# Patient Record
Sex: Female | Born: 1960 | Race: Black or African American | Hispanic: No | Smoking: Current every day smoker
Health system: Southern US, Community
[De-identification: ages and names within clinical notes are randomized; demographics above are authoritative.]

## PROBLEM LIST (undated history)

## (undated) DIAGNOSIS — I2510 Atherosclerotic heart disease of native coronary artery without angina pectoris: Secondary | ICD-10-CM

## (undated) DIAGNOSIS — I429 Cardiomyopathy, unspecified: Secondary | ICD-10-CM

## (undated) DIAGNOSIS — K5010 Crohn's disease of large intestine without complications: Secondary | ICD-10-CM

## (undated) DIAGNOSIS — I10 Essential (primary) hypertension: Secondary | ICD-10-CM

---

## 2014-05-16 DIAGNOSIS — K5010 Crohn's disease of large intestine without complications: Secondary | ICD-10-CM | POA: Diagnosis not present

## 2014-05-16 DIAGNOSIS — Z79899 Other long term (current) drug therapy: Secondary | ICD-10-CM | POA: Diagnosis not present

## 2014-06-28 DIAGNOSIS — Z79899 Other long term (current) drug therapy: Secondary | ICD-10-CM | POA: Diagnosis not present

## 2014-06-28 DIAGNOSIS — K5010 Crohn's disease of large intestine without complications: Secondary | ICD-10-CM | POA: Diagnosis not present

## 2014-08-26 DIAGNOSIS — Z79899 Other long term (current) drug therapy: Secondary | ICD-10-CM | POA: Diagnosis not present

## 2014-08-26 DIAGNOSIS — K5010 Crohn's disease of large intestine without complications: Secondary | ICD-10-CM | POA: Diagnosis not present

## 2014-10-07 DIAGNOSIS — K5010 Crohn's disease of large intestine without complications: Secondary | ICD-10-CM | POA: Diagnosis not present

## 2014-10-07 DIAGNOSIS — Z79899 Other long term (current) drug therapy: Secondary | ICD-10-CM | POA: Diagnosis not present

## 2014-10-24 DIAGNOSIS — H40003 Preglaucoma, unspecified, bilateral: Secondary | ICD-10-CM | POA: Diagnosis not present

## 2014-11-18 DIAGNOSIS — K5010 Crohn's disease of large intestine without complications: Secondary | ICD-10-CM | POA: Diagnosis not present

## 2014-11-18 DIAGNOSIS — Z79899 Other long term (current) drug therapy: Secondary | ICD-10-CM | POA: Diagnosis not present

## 2014-12-30 DIAGNOSIS — K5010 Crohn's disease of large intestine without complications: Secondary | ICD-10-CM | POA: Diagnosis not present

## 2014-12-30 DIAGNOSIS — Z79899 Other long term (current) drug therapy: Secondary | ICD-10-CM | POA: Diagnosis not present

## 2014-12-30 DIAGNOSIS — Z5112 Encounter for antineoplastic immunotherapy: Secondary | ICD-10-CM | POA: Diagnosis not present

## 2015-01-18 DIAGNOSIS — I10 Essential (primary) hypertension: Secondary | ICD-10-CM | POA: Diagnosis not present

## 2015-01-18 DIAGNOSIS — I5022 Chronic systolic (congestive) heart failure: Secondary | ICD-10-CM | POA: Diagnosis not present

## 2015-01-18 DIAGNOSIS — E785 Hyperlipidemia, unspecified: Secondary | ICD-10-CM | POA: Diagnosis not present

## 2015-02-10 DIAGNOSIS — Z79899 Other long term (current) drug therapy: Secondary | ICD-10-CM | POA: Diagnosis not present

## 2015-02-10 DIAGNOSIS — K5010 Crohn's disease of large intestine without complications: Secondary | ICD-10-CM | POA: Diagnosis not present

## 2015-03-08 DIAGNOSIS — L400 Psoriasis vulgaris: Secondary | ICD-10-CM | POA: Diagnosis not present

## 2015-03-24 DIAGNOSIS — K5010 Crohn's disease of large intestine without complications: Secondary | ICD-10-CM | POA: Diagnosis not present

## 2015-03-24 DIAGNOSIS — Z79899 Other long term (current) drug therapy: Secondary | ICD-10-CM | POA: Diagnosis not present

## 2015-05-05 DIAGNOSIS — Z79899 Other long term (current) drug therapy: Secondary | ICD-10-CM | POA: Diagnosis not present

## 2015-05-05 DIAGNOSIS — K5010 Crohn's disease of large intestine without complications: Secondary | ICD-10-CM | POA: Diagnosis not present

## 2015-05-10 DIAGNOSIS — L400 Psoriasis vulgaris: Secondary | ICD-10-CM | POA: Diagnosis not present

## 2015-06-16 DIAGNOSIS — Z79899 Other long term (current) drug therapy: Secondary | ICD-10-CM | POA: Diagnosis not present

## 2015-06-16 DIAGNOSIS — K5010 Crohn's disease of large intestine without complications: Secondary | ICD-10-CM | POA: Diagnosis not present

## 2015-07-28 DIAGNOSIS — Z79899 Other long term (current) drug therapy: Secondary | ICD-10-CM | POA: Diagnosis not present

## 2015-07-28 DIAGNOSIS — K5010 Crohn's disease of large intestine without complications: Secondary | ICD-10-CM | POA: Diagnosis not present

## 2015-08-03 DIAGNOSIS — Z79899 Other long term (current) drug therapy: Secondary | ICD-10-CM | POA: Diagnosis not present

## 2015-08-03 DIAGNOSIS — K5010 Crohn's disease of large intestine without complications: Secondary | ICD-10-CM | POA: Diagnosis not present

## 2015-08-09 DIAGNOSIS — L299 Pruritus, unspecified: Secondary | ICD-10-CM | POA: Diagnosis not present

## 2015-08-09 DIAGNOSIS — L400 Psoriasis vulgaris: Secondary | ICD-10-CM | POA: Diagnosis not present

## 2015-09-22 DIAGNOSIS — Z79899 Other long term (current) drug therapy: Secondary | ICD-10-CM | POA: Diagnosis not present

## 2015-09-22 DIAGNOSIS — K5010 Crohn's disease of large intestine without complications: Secondary | ICD-10-CM | POA: Diagnosis not present

## 2015-11-16 DIAGNOSIS — D127 Benign neoplasm of rectosigmoid junction: Secondary | ICD-10-CM | POA: Diagnosis not present

## 2015-11-16 DIAGNOSIS — Z79899 Other long term (current) drug therapy: Secondary | ICD-10-CM | POA: Diagnosis not present

## 2015-11-16 DIAGNOSIS — Z1211 Encounter for screening for malignant neoplasm of colon: Secondary | ICD-10-CM | POA: Diagnosis not present

## 2015-11-16 DIAGNOSIS — K5190 Ulcerative colitis, unspecified, without complications: Secondary | ICD-10-CM | POA: Diagnosis not present

## 2015-11-16 DIAGNOSIS — Z880 Allergy status to penicillin: Secondary | ICD-10-CM | POA: Diagnosis not present

## 2015-11-16 DIAGNOSIS — D120 Benign neoplasm of cecum: Secondary | ICD-10-CM | POA: Diagnosis not present

## 2015-11-16 DIAGNOSIS — K5010 Crohn's disease of large intestine without complications: Secondary | ICD-10-CM | POA: Diagnosis not present

## 2016-01-11 DIAGNOSIS — K5010 Crohn's disease of large intestine without complications: Secondary | ICD-10-CM | POA: Diagnosis not present

## 2016-01-11 DIAGNOSIS — Z79899 Other long term (current) drug therapy: Secondary | ICD-10-CM | POA: Diagnosis not present

## 2016-01-24 DIAGNOSIS — R5383 Other fatigue: Secondary | ICD-10-CM | POA: Diagnosis not present

## 2016-01-24 DIAGNOSIS — F17200 Nicotine dependence, unspecified, uncomplicated: Secondary | ICD-10-CM | POA: Diagnosis not present

## 2016-01-24 DIAGNOSIS — I351 Nonrheumatic aortic (valve) insufficiency: Secondary | ICD-10-CM | POA: Diagnosis not present

## 2016-01-24 DIAGNOSIS — I420 Dilated cardiomyopathy: Secondary | ICD-10-CM | POA: Diagnosis not present

## 2016-01-24 DIAGNOSIS — R931 Abnormal findings on diagnostic imaging of heart and coronary circulation: Secondary | ICD-10-CM | POA: Diagnosis not present

## 2016-01-24 DIAGNOSIS — I2510 Atherosclerotic heart disease of native coronary artery without angina pectoris: Secondary | ICD-10-CM | POA: Diagnosis not present

## 2016-01-24 DIAGNOSIS — I340 Nonrheumatic mitral (valve) insufficiency: Secondary | ICD-10-CM | POA: Diagnosis not present

## 2016-01-24 DIAGNOSIS — I10 Essential (primary) hypertension: Secondary | ICD-10-CM | POA: Diagnosis not present

## 2016-01-24 DIAGNOSIS — K5010 Crohn's disease of large intestine without complications: Secondary | ICD-10-CM | POA: Diagnosis not present

## 2016-01-24 DIAGNOSIS — Z79899 Other long term (current) drug therapy: Secondary | ICD-10-CM | POA: Diagnosis not present

## 2016-03-07 DIAGNOSIS — Z79899 Other long term (current) drug therapy: Secondary | ICD-10-CM | POA: Diagnosis not present

## 2016-03-07 DIAGNOSIS — K5010 Crohn's disease of large intestine without complications: Secondary | ICD-10-CM | POA: Diagnosis not present

## 2016-05-02 DIAGNOSIS — Z79899 Other long term (current) drug therapy: Secondary | ICD-10-CM | POA: Diagnosis not present

## 2016-05-02 DIAGNOSIS — K5010 Crohn's disease of large intestine without complications: Secondary | ICD-10-CM | POA: Diagnosis not present

## 2016-06-28 DIAGNOSIS — Z79899 Other long term (current) drug therapy: Secondary | ICD-10-CM | POA: Diagnosis not present

## 2016-06-28 DIAGNOSIS — K5010 Crohn's disease of large intestine without complications: Secondary | ICD-10-CM | POA: Diagnosis not present

## 2016-08-28 DIAGNOSIS — Z79899 Other long term (current) drug therapy: Secondary | ICD-10-CM | POA: Diagnosis not present

## 2016-08-28 DIAGNOSIS — K50113 Crohn's disease of large intestine with fistula: Secondary | ICD-10-CM | POA: Diagnosis not present

## 2016-08-29 ENCOUNTER — Ambulatory Visit (HOSPITAL_COMMUNITY)
Admission: EM | Admit: 2016-08-29 | Discharge: 2016-08-29 | Disposition: A | Discharge status: Home / Self Care | Payer: Medicare Other | Attending: Family Medicine | Admitting: Family Medicine

## 2016-08-29 ENCOUNTER — Ambulatory Visit (INDEPENDENT_AMBULATORY_CARE_PROVIDER_SITE_OTHER): Payer: Medicare Other

## 2016-08-29 ENCOUNTER — Encounter (HOSPITAL_COMMUNITY): Payer: Self-pay | Admitting: Family Medicine

## 2016-08-29 DIAGNOSIS — R0602 Shortness of breath: Secondary | ICD-10-CM

## 2016-08-29 DIAGNOSIS — T7840XA Allergy, unspecified, initial encounter: Secondary | ICD-10-CM | POA: Diagnosis not present

## 2016-08-29 DIAGNOSIS — L309 Dermatitis, unspecified: Secondary | ICD-10-CM

## 2016-08-29 DIAGNOSIS — R05 Cough: Secondary | ICD-10-CM | POA: Diagnosis not present

## 2016-08-29 HISTORY — DX: Essential (primary) hypertension: I10

## 2016-08-29 HISTORY — DX: Atherosclerotic heart disease of native coronary artery without angina pectoris: I25.10

## 2016-08-29 HISTORY — DX: Cardiomyopathy, unspecified: I42.9

## 2016-08-29 HISTORY — DX: Crohn's disease of large intestine without complications: K50.10

## 2016-08-29 MED ORDER — PREDNISONE 20 MG PO TABS: ORAL_TABLET | ORAL | 0 refills | Status: DC

## 2016-08-29 NOTE — ED Provider Notes (Signed)
[place]    CSN: [id number] Arrival date & time: 08/29/16  1142     History   Chief Complaint Chief Complaint  Patient presents with  . Allergic Reaction    HPI Erika Allen is a 56 y.o. female.   Pt here for allergic reaction. sts that she had her remicade infusion yesterday and woke up this am with swollen lips and rash.   This is a 56 year old woman who just moved here from [facility]. She is being treated at [facility] for chronic fistula arising Crohn's disease. She receives Remicade on a regular basis and usually has some degree of rash after the infusion. She had an infusion yesterday and had not only the rash on her face but also lip swelling. When she called her doctor's at [facility] today they told her to come to urgent care for evaluation.  Patient has also had shortness of breath over the last 24 hours. She has a history of congestive heart failure and is not sure whether this new shortness of breath is a reaction to the Remicade or whether it is a return of the congestive heart failure.      Past Medical History:  Diagnosis Date  . CAD (coronary artery disease)   . Cardiomyopathy ([place])   . Crohn's colitis ([place])   . Hypertension     There are no active problems to display for this patient.   History reviewed. No pertinent surgical history.  OB History    No data available       Home Medications    Prior to Admission medications   Medication Sig Start Date End Date Taking? Authorizing Provider  predniSONE (DELTASONE) 20 MG tablet Two daily with food 08/29/16   Robyn Haber, MD    Family History History reviewed. No pertinent family history.  Social History Social History  Substance Use Topics  . Smoking status: Current Every Day Smoker  . Smokeless tobacco: Never Used  . Alcohol use Not on file     Allergies   Amoxicillin; Erythromycin; Flagyl [metronidazole]; and Latex   Review of Systems Review of Systems    Respiratory: Positive for cough and shortness of breath.   Skin: Positive for rash.     Physical Exam Triage Vital Signs ED Triage Vitals [08/29/16 1230]  Enc Vitals Group     BP 140/86     Pulse Rate 61     Resp 18     Temp 98.5 F (36.9 C)     Temp Source Oral     SpO2 99 %     Weight      Height      Head Circumference      Peak Flow      Pain Score      Pain Loc      Pain Edu?      Excl. in [place]?    No data found.   Updated Vital Signs BP 140/86   Pulse 61   Temp 98.5 F (36.9 C) (Oral)   Resp 18   SpO2 99%    Physical Exam  Constitutional: She is oriented to person, place, and time. She appears well-developed and well-nourished.  HENT:  Right Ear: External ear normal.  Left Ear: External ear normal.  Patient has a papular facial rash with erythema. Her lips are moderately swollen. Tongue is normal and posterior pharynx is clear  Eyes: Conjunctivae and EOM are normal. Pupils are equal, round, and reactive to light.  Neck: Normal range of motion. Neck supple.  Cardiovascular: Normal rate, regular rhythm and normal heart sounds.   Pulmonary/Chest: Effort normal.  Patient has a few fine rales at each of her bases  Musculoskeletal: Normal range of motion. She exhibits no edema, tenderness or deformity.  Neurological: She is alert and oriented to person, place, and time.  Skin: Skin is warm and dry.  Nursing note and vitals reviewed.    UC Treatments / Results  Labs (all labs ordered are listed, but only abnormal results are displayed) Labs Reviewed - No data to display  EKG  EKG Interpretation None       Radiology Dg Chest 2 View  Result Date: 08/29/2016 CLINICAL DATA:  Congestive heart failure.  Shortness of breath EXAM: CHEST  2 VIEW COMPARISON:  None. FINDINGS: Cardiomegaly without edema or effusion. Mild aortic tortuosity. Negative hila. No air bronchogram or pneumothorax. No osseous findings. IMPRESSION: Cardiomegaly without edema.  Electronically Signed   By: Monte Fantasia M.D.   On: 08/29/2016 13:14    Procedures Procedures (including critical care time)  Medications Ordered in UC Medications - No data to display   Initial Impression / Assessment and Plan / UC Course  I have reviewed the triage vital signs and the nursing notes.  Pertinent labs & imaging results that were available during my care of the patient were reviewed by me and considered in my medical decision making (see chart for details).     Final Clinical Impressions(s) / UC Diagnoses   Final diagnoses:  Allergic reaction, initial encounter    New Prescriptions New Prescriptions   PREDNISONE (DELTASONE) 20 MG TABLET    Two daily with food     Robyn Haber, MD 08/29/16 1322

## 2016-08-29 NOTE — ED Triage Notes (Signed)
Pt here for allergic reaction. sts that she had her remicade infusion yesterday and woke up this am with swollen lips and rash.

## 2016-08-29 NOTE — Discharge Instructions (Signed)
The x-ray does not show heart failure. I believe your shortness of breath is related to the Remicade shot.  I'm giving you steroids to combat the allergic reaction as manifested by the rash, swollen lips, and shortness of breath.   While on prednisone, make sure you take your Lasix regularly since prednisone can cause fluid retention

## 2016-10-16 ENCOUNTER — Ambulatory Visit (HOSPITAL_COMMUNITY)
Admission: EM | Admit: 2016-10-16 | Discharge: 2016-10-16 | Disposition: A | Discharge status: Home / Self Care | Payer: Medicare Other | Attending: Internal Medicine | Admitting: Internal Medicine

## 2016-10-16 ENCOUNTER — Encounter (HOSPITAL_COMMUNITY): Payer: Self-pay | Admitting: Emergency Medicine

## 2016-10-16 DIAGNOSIS — R21 Rash and other nonspecific skin eruption: Secondary | ICD-10-CM | POA: Diagnosis not present

## 2016-10-16 DIAGNOSIS — T7840XA Allergy, unspecified, initial encounter: Secondary | ICD-10-CM

## 2016-10-16 MED ORDER — PREDNISONE 10 MG PO TABS: ORAL_TABLET | ORAL | 0 refills | Status: DC

## 2016-10-16 MED ORDER — TRIAMCINOLONE ACETONIDE 0.1 % EX CREA: 1.0000 "application " | TOPICAL_CREAM | Freq: Two times a day (BID) | CUTANEOUS | 0 refills | Status: DC

## 2016-10-16 NOTE — ED Triage Notes (Signed)
April 29 was seen for a rash.  Was treated with prednisone, rash left.  As soon as prednisone was finished, a week later rash returned.  Rash itches, burning

## 2016-10-16 NOTE — ED Provider Notes (Signed)
CSN: [id number]     Arrival date & time 10/16/16  1106 History   First MD Initiated Contact with Patient 10/16/16 1237     Chief Complaint  Patient presents with  . Rash   (Consider location/radiation/quality/duration/timing/severity/associated sxs/prior Treatment) Patient c/o allergic rxn and rash.  Patient states was on prednisone and this helped and rash has returned.  She has an appointment in a few weeks with Dermatology.   The history is provided by the patient.  Rash  Location:  Face and head/neck Head/neck rash location:  L neck, R neck, L ear, R ear and head Facial rash location:  Face Quality: blistering, itchiness and redness   Chronicity:  New Relieved by:  Nothing Worsened by:  Nothing Ineffective treatments:  None tried   Past Medical History:  Diagnosis Date  . CAD (coronary artery disease)   . Cardiomyopathy ([place])   . Crohn's colitis ([place])   . Hypertension    No past surgical history on file. No family history on file. Social History  Substance Use Topics  . Smoking status: Current Every Day Smoker  . Smokeless tobacco: Never Used  . Alcohol use No   OB History    No data available     Review of Systems  Constitutional: Negative.   HENT: Negative.   Eyes: Negative.   Respiratory: Negative.   Cardiovascular: Negative.   Gastrointestinal: Negative.   Endocrine: Negative.   Genitourinary: Negative.   Skin: Positive for rash.  Allergic/Immunologic: Negative.   Neurological: Negative.   Hematological: Negative.   Psychiatric/Behavioral: Negative.     Allergies  Amoxicillin; Erythromycin; Flagyl [metronidazole]; and Latex  Home Medications   Prior to Admission medications   Medication Sig Start Date End Date Taking? Authorizing Provider  InFLIXimab (REMICADE IV) Inject into the vein.   Yes [provider]  OVER THE COUNTER MEDICATION Blood pressure medicine   Yes [provider]  Valsartan (DIOVAN PO) Take by mouth.   Yes  [provider]  predniSONE (DELTASONE) 10 MG tablet Take 4 po qd x 3d then 3po qd x 3d then 2po qd x3d then 2po qd x3d then 1po qd x 3d then stop 10/16/16   Lysbeth Penner, FNP  predniSONE (DELTASONE) 20 MG tablet Two daily with food 08/29/16   Robyn Haber, MD  triamcinolone cream (KENALOG) 0.1 % Apply 1 application topically 2 (two) times daily. 10/16/16   Lysbeth Penner, FNP   Meds Ordered and Administered this Visit  Medications - No data to display  BP (!) 168/109 (BP Location: Right Arm)   Pulse 64   Temp 97.8 F (36.6 C) (Oral)   Resp 18   SpO2 98%  No data found.   Physical Exam  Constitutional: She is oriented to person, place, and time. She appears well-developed and well-nourished.  HENT:  Head: Normocephalic and atraumatic.  Eyes: Conjunctivae and EOM are normal. Pupils are equal, round, and reactive to light.  Neck: Normal range of motion. Neck supple.  Cardiovascular: Normal rate, regular rhythm and normal heart sounds.   Pulmonary/Chest: Effort normal and breath sounds normal.  Neurological: She is alert and oriented to person, place, and time.  Skin: Rash noted.  Erythematous raised itchy rash on face neck and arms  Nursing note and vitals reviewed.   Urgent Care Course     Procedures (including critical care time)  Labs Review Labs Reviewed - No data to display  Imaging Review No results found.   Visual Acuity  Review  Right Eye Distance:   Left Eye Distance:   Bilateral Distance:    Right Eye Near:   Left Eye Near:    Bilateral Near:         MDM   1. Rash   2. Allergic reaction, initial encounter   3. Rash and nonspecific skin eruption    Prenisone 10mg  4 x 3 then 3 x 3 then 2x3 then 1x3 then stop #30 Triamcinolone Cream  Follow up with Dermatology    Lysbeth Penner, FNP 10/16/16 1345

## 2016-10-26 DIAGNOSIS — L270 Generalized skin eruption due to drugs and medicaments taken internally: Secondary | ICD-10-CM | POA: Diagnosis not present

## 2016-10-26 DIAGNOSIS — K50118 Crohn's disease of large intestine with other complication: Secondary | ICD-10-CM | POA: Diagnosis not present

## 2016-10-26 DIAGNOSIS — Z79899 Other long term (current) drug therapy: Secondary | ICD-10-CM | POA: Diagnosis not present

## 2016-11-01 DIAGNOSIS — K610 Anal abscess: Secondary | ICD-10-CM | POA: Diagnosis not present

## 2016-11-01 DIAGNOSIS — K50113 Crohn's disease of large intestine with fistula: Secondary | ICD-10-CM | POA: Diagnosis not present

## 2016-11-01 DIAGNOSIS — L270 Generalized skin eruption due to drugs and medicaments taken internally: Secondary | ICD-10-CM | POA: Diagnosis not present

## 2016-11-01 DIAGNOSIS — I427 Cardiomyopathy due to drug and external agent: Secondary | ICD-10-CM | POA: Diagnosis not present

## 2016-11-01 DIAGNOSIS — F17210 Nicotine dependence, cigarettes, uncomplicated: Secondary | ICD-10-CM | POA: Diagnosis not present

## 2016-11-01 DIAGNOSIS — R238 Other skin changes: Secondary | ICD-10-CM | POA: Diagnosis not present

## 2016-11-20 DIAGNOSIS — L308 Other specified dermatitis: Secondary | ICD-10-CM | POA: Diagnosis not present

## 2016-11-20 DIAGNOSIS — L400 Psoriasis vulgaris: Secondary | ICD-10-CM | POA: Diagnosis not present

## 2017-01-09 DIAGNOSIS — K50119 Crohn's disease of large intestine with unspecified complications: Secondary | ICD-10-CM | POA: Diagnosis not present

## 2017-01-09 DIAGNOSIS — Z79899 Other long term (current) drug therapy: Secondary | ICD-10-CM | POA: Diagnosis not present

## 2017-01-23 DIAGNOSIS — K50119 Crohn's disease of large intestine with unspecified complications: Secondary | ICD-10-CM | POA: Diagnosis not present

## 2017-01-23 DIAGNOSIS — Z79899 Other long term (current) drug therapy: Secondary | ICD-10-CM | POA: Diagnosis not present

## 2017-01-30 DIAGNOSIS — I10 Essential (primary) hypertension: Secondary | ICD-10-CM | POA: Diagnosis not present

## 2017-01-30 DIAGNOSIS — Z79899 Other long term (current) drug therapy: Secondary | ICD-10-CM | POA: Diagnosis not present

## 2017-01-30 DIAGNOSIS — K50113 Crohn's disease of large intestine with fistula: Secondary | ICD-10-CM | POA: Diagnosis not present

## 2017-02-20 DIAGNOSIS — Z79899 Other long term (current) drug therapy: Secondary | ICD-10-CM | POA: Diagnosis not present

## 2017-02-20 DIAGNOSIS — K50119 Crohn's disease of large intestine with unspecified complications: Secondary | ICD-10-CM | POA: Diagnosis not present

## 2017-03-11 DIAGNOSIS — R300 Dysuria: Secondary | ICD-10-CM | POA: Diagnosis not present

## 2017-03-11 DIAGNOSIS — Z79899 Other long term (current) drug therapy: Secondary | ICD-10-CM | POA: Diagnosis not present

## 2017-03-11 DIAGNOSIS — Z720 Tobacco use: Secondary | ICD-10-CM | POA: Diagnosis not present

## 2017-03-11 DIAGNOSIS — Z113 Encounter for screening for infections with a predominantly sexual mode of transmission: Secondary | ICD-10-CM | POA: Diagnosis not present

## 2017-03-11 DIAGNOSIS — F17210 Nicotine dependence, cigarettes, uncomplicated: Secondary | ICD-10-CM | POA: Diagnosis not present

## 2017-03-11 DIAGNOSIS — Z114 Encounter for screening for human immunodeficiency virus [HIV]: Secondary | ICD-10-CM | POA: Diagnosis not present

## 2017-03-11 DIAGNOSIS — B372 Candidiasis of skin and nail: Secondary | ICD-10-CM | POA: Diagnosis not present

## 2017-03-11 DIAGNOSIS — E559 Vitamin D deficiency, unspecified: Secondary | ICD-10-CM | POA: Diagnosis not present

## 2017-03-11 DIAGNOSIS — I509 Heart failure, unspecified: Secondary | ICD-10-CM | POA: Diagnosis not present

## 2017-03-11 DIAGNOSIS — I10 Essential (primary) hypertension: Secondary | ICD-10-CM | POA: Diagnosis not present

## 2017-03-11 DIAGNOSIS — K50113 Crohn's disease of large intestine with fistula: Secondary | ICD-10-CM | POA: Diagnosis not present

## 2017-03-11 DIAGNOSIS — N898 Other specified noninflammatory disorders of vagina: Secondary | ICD-10-CM | POA: Diagnosis not present

## 2017-03-11 DIAGNOSIS — B3749 Other urogenital candidiasis: Secondary | ICD-10-CM | POA: Diagnosis not present

## 2017-03-11 DIAGNOSIS — I110 Hypertensive heart disease with heart failure: Secondary | ICD-10-CM | POA: Diagnosis not present

## 2017-03-19 DIAGNOSIS — I2510 Atherosclerotic heart disease of native coronary artery without angina pectoris: Secondary | ICD-10-CM | POA: Diagnosis not present

## 2017-03-19 DIAGNOSIS — F17200 Nicotine dependence, unspecified, uncomplicated: Secondary | ICD-10-CM | POA: Diagnosis not present

## 2017-03-26 DIAGNOSIS — B373 Candidiasis of vulva and vagina: Secondary | ICD-10-CM | POA: Diagnosis not present

## 2017-03-26 DIAGNOSIS — B9689 Other specified bacterial agents as the cause of diseases classified elsewhere: Secondary | ICD-10-CM | POA: Diagnosis not present

## 2017-03-26 DIAGNOSIS — N760 Acute vaginitis: Secondary | ICD-10-CM | POA: Diagnosis not present

## 2017-03-26 DIAGNOSIS — N898 Other specified noninflammatory disorders of vagina: Secondary | ICD-10-CM | POA: Diagnosis not present

## 2017-04-28 DIAGNOSIS — K50119 Crohn's disease of large intestine with unspecified complications: Secondary | ICD-10-CM | POA: Diagnosis not present

## 2017-04-28 DIAGNOSIS — Z79899 Other long term (current) drug therapy: Secondary | ICD-10-CM | POA: Diagnosis not present

## 2017-05-01 DIAGNOSIS — K50113 Crohn's disease of large intestine with fistula: Secondary | ICD-10-CM | POA: Diagnosis not present

## 2017-05-01 DIAGNOSIS — Z79899 Other long term (current) drug therapy: Secondary | ICD-10-CM | POA: Diagnosis not present

## 2017-05-14 DIAGNOSIS — R21 Rash and other nonspecific skin eruption: Secondary | ICD-10-CM | POA: Diagnosis not present

## 2017-05-14 DIAGNOSIS — Z0000 Encounter for general adult medical examination without abnormal findings: Secondary | ICD-10-CM | POA: Diagnosis not present

## 2017-05-14 DIAGNOSIS — K50118 Crohn's disease of large intestine with other complication: Secondary | ICD-10-CM | POA: Diagnosis not present

## 2017-05-14 DIAGNOSIS — Z1231 Encounter for screening mammogram for malignant neoplasm of breast: Secondary | ICD-10-CM | POA: Diagnosis not present

## 2017-05-14 DIAGNOSIS — Z87891 Personal history of nicotine dependence: Secondary | ICD-10-CM | POA: Diagnosis not present

## 2017-05-14 DIAGNOSIS — Z720 Tobacco use: Secondary | ICD-10-CM | POA: Diagnosis not present

## 2017-05-14 DIAGNOSIS — N898 Other specified noninflammatory disorders of vagina: Secondary | ICD-10-CM | POA: Diagnosis not present

## 2017-05-21 DIAGNOSIS — B9689 Other specified bacterial agents as the cause of diseases classified elsewhere: Secondary | ICD-10-CM | POA: Diagnosis not present

## 2017-05-21 DIAGNOSIS — Z87891 Personal history of nicotine dependence: Secondary | ICD-10-CM | POA: Diagnosis not present

## 2017-05-21 DIAGNOSIS — N760 Acute vaginitis: Secondary | ICD-10-CM | POA: Diagnosis not present

## 2017-05-21 DIAGNOSIS — I2510 Atherosclerotic heart disease of native coronary artery without angina pectoris: Secondary | ICD-10-CM | POA: Diagnosis not present

## 2017-06-20 DIAGNOSIS — K50113 Crohn's disease of large intestine with fistula: Secondary | ICD-10-CM | POA: Diagnosis not present

## 2017-06-20 DIAGNOSIS — L732 Hidradenitis suppurativa: Secondary | ICD-10-CM | POA: Diagnosis not present

## 2017-06-20 DIAGNOSIS — Z79899 Other long term (current) drug therapy: Secondary | ICD-10-CM | POA: Diagnosis not present

## 2017-06-25 DIAGNOSIS — R928 Other abnormal and inconclusive findings on diagnostic imaging of breast: Secondary | ICD-10-CM | POA: Diagnosis not present

## 2017-06-25 DIAGNOSIS — F17210 Nicotine dependence, cigarettes, uncomplicated: Secondary | ICD-10-CM | POA: Diagnosis not present

## 2017-06-25 DIAGNOSIS — K50113 Crohn's disease of large intestine with fistula: Secondary | ICD-10-CM | POA: Diagnosis not present

## 2017-06-25 DIAGNOSIS — R918 Other nonspecific abnormal finding of lung field: Secondary | ICD-10-CM | POA: Diagnosis not present

## 2017-06-25 DIAGNOSIS — Z122 Encounter for screening for malignant neoplasm of respiratory organs: Secondary | ICD-10-CM | POA: Diagnosis not present

## 2017-06-25 DIAGNOSIS — Z87891 Personal history of nicotine dependence: Secondary | ICD-10-CM | POA: Diagnosis not present

## 2017-06-25 DIAGNOSIS — Z1231 Encounter for screening mammogram for malignant neoplasm of breast: Secondary | ICD-10-CM | POA: Diagnosis not present

## 2017-06-25 DIAGNOSIS — I2510 Atherosclerotic heart disease of native coronary artery without angina pectoris: Secondary | ICD-10-CM | POA: Diagnosis not present

## 2017-06-25 DIAGNOSIS — J432 Centrilobular emphysema: Secondary | ICD-10-CM | POA: Diagnosis not present

## 2017-06-25 DIAGNOSIS — Z79899 Other long term (current) drug therapy: Secondary | ICD-10-CM | POA: Diagnosis not present

## 2017-06-26 DIAGNOSIS — L732 Hidradenitis suppurativa: Secondary | ICD-10-CM | POA: Diagnosis not present

## 2017-06-26 DIAGNOSIS — Z1329 Encounter for screening for other suspected endocrine disorder: Secondary | ICD-10-CM | POA: Diagnosis not present

## 2017-06-26 DIAGNOSIS — Z13228 Encounter for screening for other metabolic disorders: Secondary | ICD-10-CM | POA: Diagnosis not present

## 2017-06-26 DIAGNOSIS — Z130 Encounter for screening for diseases of the blood and blood-forming organs and certain disorders involving the immune mechanism: Secondary | ICD-10-CM | POA: Diagnosis not present

## 2017-07-16 DIAGNOSIS — I2510 Atherosclerotic heart disease of native coronary artery without angina pectoris: Secondary | ICD-10-CM | POA: Diagnosis not present

## 2017-07-16 DIAGNOSIS — J432 Centrilobular emphysema: Secondary | ICD-10-CM | POA: Diagnosis not present

## 2017-07-16 DIAGNOSIS — R911 Solitary pulmonary nodule: Secondary | ICD-10-CM | POA: Diagnosis not present

## 2017-07-16 DIAGNOSIS — N898 Other specified noninflammatory disorders of vagina: Secondary | ICD-10-CM | POA: Diagnosis not present

## 2017-07-16 DIAGNOSIS — Z720 Tobacco use: Secondary | ICD-10-CM | POA: Diagnosis not present

## 2017-07-16 DIAGNOSIS — Z9189 Other specified personal risk factors, not elsewhere classified: Secondary | ICD-10-CM | POA: Diagnosis not present

## 2017-07-16 DIAGNOSIS — L732 Hidradenitis suppurativa: Secondary | ICD-10-CM | POA: Diagnosis not present

## 2017-07-31 DIAGNOSIS — R918 Other nonspecific abnormal finding of lung field: Secondary | ICD-10-CM | POA: Diagnosis not present

## 2017-07-31 DIAGNOSIS — R911 Solitary pulmonary nodule: Secondary | ICD-10-CM | POA: Diagnosis not present

## 2017-07-31 DIAGNOSIS — J438 Other emphysema: Secondary | ICD-10-CM | POA: Diagnosis not present

## 2017-07-31 DIAGNOSIS — F17210 Nicotine dependence, cigarettes, uncomplicated: Secondary | ICD-10-CM | POA: Diagnosis not present

## 2017-07-31 DIAGNOSIS — Z122 Encounter for screening for malignant neoplasm of respiratory organs: Secondary | ICD-10-CM | POA: Diagnosis not present

## 2017-07-31 DIAGNOSIS — Z79899 Other long term (current) drug therapy: Secondary | ICD-10-CM | POA: Diagnosis not present

## 2017-08-13 DIAGNOSIS — R9431 Abnormal electrocardiogram [ECG] [EKG]: Secondary | ICD-10-CM | POA: Diagnosis not present

## 2017-08-13 DIAGNOSIS — F17210 Nicotine dependence, cigarettes, uncomplicated: Secondary | ICD-10-CM | POA: Diagnosis not present

## 2017-08-13 DIAGNOSIS — I517 Cardiomegaly: Secondary | ICD-10-CM | POA: Diagnosis not present

## 2017-08-13 DIAGNOSIS — R911 Solitary pulmonary nodule: Secondary | ICD-10-CM | POA: Diagnosis not present

## 2017-08-13 DIAGNOSIS — C3490 Malignant neoplasm of unspecified part of unspecified bronchus or lung: Secondary | ICD-10-CM | POA: Diagnosis not present

## 2017-08-13 DIAGNOSIS — I427 Cardiomyopathy due to drug and external agent: Secondary | ICD-10-CM | POA: Diagnosis not present

## 2017-08-20 DIAGNOSIS — K5010 Crohn's disease of large intestine without complications: Secondary | ICD-10-CM | POA: Diagnosis not present

## 2017-08-20 DIAGNOSIS — F40240 Claustrophobia: Secondary | ICD-10-CM | POA: Diagnosis not present

## 2017-08-20 DIAGNOSIS — F17210 Nicotine dependence, cigarettes, uncomplicated: Secondary | ICD-10-CM | POA: Diagnosis not present

## 2017-08-20 DIAGNOSIS — Z79899 Other long term (current) drug therapy: Secondary | ICD-10-CM | POA: Diagnosis not present

## 2017-08-20 DIAGNOSIS — D491 Neoplasm of unspecified behavior of respiratory system: Secondary | ICD-10-CM | POA: Diagnosis not present

## 2017-08-20 DIAGNOSIS — Z6837 Body mass index (BMI) 37.0-37.9, adult: Secondary | ICD-10-CM | POA: Diagnosis not present

## 2017-08-20 DIAGNOSIS — I10 Essential (primary) hypertension: Secondary | ICD-10-CM | POA: Diagnosis not present

## 2017-08-20 DIAGNOSIS — E669 Obesity, unspecified: Secondary | ICD-10-CM | POA: Diagnosis not present

## 2017-08-20 DIAGNOSIS — D1431 Benign neoplasm of right bronchus and lung: Secondary | ICD-10-CM | POA: Diagnosis not present

## 2017-08-20 DIAGNOSIS — F329 Major depressive disorder, single episode, unspecified: Secondary | ICD-10-CM | POA: Diagnosis not present

## 2017-08-20 DIAGNOSIS — F419 Anxiety disorder, unspecified: Secondary | ICD-10-CM | POA: Diagnosis not present

## 2017-08-20 DIAGNOSIS — R911 Solitary pulmonary nodule: Secondary | ICD-10-CM | POA: Diagnosis not present

## 2017-08-20 DIAGNOSIS — I517 Cardiomegaly: Secondary | ICD-10-CM | POA: Diagnosis not present

## 2017-08-20 DIAGNOSIS — Z881 Allergy status to other antibiotic agents status: Secondary | ICD-10-CM | POA: Diagnosis not present

## 2017-08-20 DIAGNOSIS — Z880 Allergy status to penicillin: Secondary | ICD-10-CM | POA: Diagnosis not present

## 2017-09-11 DIAGNOSIS — Z130 Encounter for screening for diseases of the blood and blood-forming organs and certain disorders involving the immune mechanism: Secondary | ICD-10-CM | POA: Diagnosis not present

## 2017-09-11 DIAGNOSIS — Z114 Encounter for screening for human immunodeficiency virus [HIV]: Secondary | ICD-10-CM | POA: Diagnosis not present

## 2017-09-11 DIAGNOSIS — F17210 Nicotine dependence, cigarettes, uncomplicated: Secondary | ICD-10-CM | POA: Diagnosis not present

## 2017-09-11 DIAGNOSIS — Z1159 Encounter for screening for other viral diseases: Secondary | ICD-10-CM | POA: Diagnosis not present

## 2017-09-11 DIAGNOSIS — L732 Hidradenitis suppurativa: Secondary | ICD-10-CM | POA: Diagnosis not present

## 2017-09-11 DIAGNOSIS — Z13228 Encounter for screening for other metabolic disorders: Secondary | ICD-10-CM | POA: Diagnosis not present

## 2017-09-11 DIAGNOSIS — Z1329 Encounter for screening for other suspected endocrine disorder: Secondary | ICD-10-CM | POA: Diagnosis not present

## 2017-09-11 DIAGNOSIS — E349 Endocrine disorder, unspecified: Secondary | ICD-10-CM | POA: Diagnosis not present

## 2017-09-11 DIAGNOSIS — Z79899 Other long term (current) drug therapy: Secondary | ICD-10-CM | POA: Diagnosis not present

## 2017-09-11 DIAGNOSIS — E038 Other specified hypothyroidism: Secondary | ICD-10-CM | POA: Diagnosis not present

## 2017-09-19 DIAGNOSIS — Z1159 Encounter for screening for other viral diseases: Secondary | ICD-10-CM | POA: Diagnosis not present

## 2017-09-19 DIAGNOSIS — R768 Other specified abnormal immunological findings in serum: Secondary | ICD-10-CM | POA: Diagnosis not present

## 2017-09-19 DIAGNOSIS — L732 Hidradenitis suppurativa: Secondary | ICD-10-CM | POA: Diagnosis not present

## 2017-09-19 DIAGNOSIS — K50111 Crohn's disease of large intestine with rectal bleeding: Secondary | ICD-10-CM | POA: Diagnosis not present

## 2017-10-03 DIAGNOSIS — Z1159 Encounter for screening for other viral diseases: Secondary | ICD-10-CM | POA: Diagnosis not present

## 2017-10-03 DIAGNOSIS — R768 Other specified abnormal immunological findings in serum: Secondary | ICD-10-CM | POA: Diagnosis not present

## 2017-10-03 DIAGNOSIS — F17210 Nicotine dependence, cigarettes, uncomplicated: Secondary | ICD-10-CM | POA: Diagnosis not present

## 2017-10-03 DIAGNOSIS — Z79899 Other long term (current) drug therapy: Secondary | ICD-10-CM | POA: Diagnosis not present

## 2017-10-03 DIAGNOSIS — R748 Abnormal levels of other serum enzymes: Secondary | ICD-10-CM | POA: Diagnosis not present

## 2017-10-03 DIAGNOSIS — R7989 Other specified abnormal findings of blood chemistry: Secondary | ICD-10-CM | POA: Diagnosis not present

## 2017-10-09 DIAGNOSIS — Z79899 Other long term (current) drug therapy: Secondary | ICD-10-CM | POA: Diagnosis not present

## 2017-10-09 DIAGNOSIS — K50119 Crohn's disease of large intestine with unspecified complications: Secondary | ICD-10-CM | POA: Diagnosis not present

## 2017-10-20 DIAGNOSIS — R8271 Bacteriuria: Secondary | ICD-10-CM | POA: Diagnosis not present

## 2017-10-20 DIAGNOSIS — N183 Chronic kidney disease, stage 3 (moderate): Secondary | ICD-10-CM | POA: Diagnosis not present

## 2017-10-20 DIAGNOSIS — R748 Abnormal levels of other serum enzymes: Secondary | ICD-10-CM | POA: Diagnosis not present

## 2017-10-20 DIAGNOSIS — Z720 Tobacco use: Secondary | ICD-10-CM | POA: Diagnosis not present

## 2017-10-20 DIAGNOSIS — Z9189 Other specified personal risk factors, not elsewhere classified: Secondary | ICD-10-CM | POA: Diagnosis not present

## 2017-10-20 DIAGNOSIS — I2510 Atherosclerotic heart disease of native coronary artery without angina pectoris: Secondary | ICD-10-CM | POA: Diagnosis not present

## 2017-10-20 DIAGNOSIS — I10 Essential (primary) hypertension: Secondary | ICD-10-CM | POA: Diagnosis not present

## 2017-10-20 DIAGNOSIS — K50118 Crohn's disease of large intestine with other complication: Secondary | ICD-10-CM | POA: Diagnosis not present

## 2017-10-20 DIAGNOSIS — L732 Hidradenitis suppurativa: Secondary | ICD-10-CM | POA: Diagnosis not present

## 2017-10-20 DIAGNOSIS — R809 Proteinuria, unspecified: Secondary | ICD-10-CM | POA: Diagnosis not present

## 2017-10-31 DIAGNOSIS — E049 Nontoxic goiter, unspecified: Secondary | ICD-10-CM | POA: Diagnosis not present

## 2017-10-31 DIAGNOSIS — R7989 Other specified abnormal findings of blood chemistry: Secondary | ICD-10-CM | POA: Diagnosis not present

## 2017-11-17 DIAGNOSIS — F17210 Nicotine dependence, cigarettes, uncomplicated: Secondary | ICD-10-CM | POA: Diagnosis not present

## 2017-11-17 DIAGNOSIS — R768 Other specified abnormal immunological findings in serum: Secondary | ICD-10-CM | POA: Diagnosis not present

## 2017-11-17 DIAGNOSIS — Z79899 Other long term (current) drug therapy: Secondary | ICD-10-CM | POA: Diagnosis not present

## 2017-11-17 DIAGNOSIS — R748 Abnormal levels of other serum enzymes: Secondary | ICD-10-CM | POA: Diagnosis not present

## 2017-11-17 DIAGNOSIS — I429 Cardiomyopathy, unspecified: Secondary | ICD-10-CM | POA: Diagnosis not present

## 2017-11-17 DIAGNOSIS — K50118 Crohn's disease of large intestine with other complication: Secondary | ICD-10-CM | POA: Diagnosis not present

## 2017-11-17 DIAGNOSIS — N183 Chronic kidney disease, stage 3 (moderate): Secondary | ICD-10-CM | POA: Diagnosis not present

## 2017-11-17 DIAGNOSIS — L732 Hidradenitis suppurativa: Secondary | ICD-10-CM | POA: Diagnosis not present

## 2017-11-25 DIAGNOSIS — Z87891 Personal history of nicotine dependence: Secondary | ICD-10-CM | POA: Diagnosis not present

## 2017-11-25 DIAGNOSIS — I10 Essential (primary) hypertension: Secondary | ICD-10-CM | POA: Diagnosis not present

## 2017-11-25 DIAGNOSIS — I2510 Atherosclerotic heart disease of native coronary artery without angina pectoris: Secondary | ICD-10-CM | POA: Diagnosis not present

## 2017-11-25 DIAGNOSIS — J432 Centrilobular emphysema: Secondary | ICD-10-CM | POA: Diagnosis not present

## 2017-12-03 DIAGNOSIS — R7989 Other specified abnormal findings of blood chemistry: Secondary | ICD-10-CM | POA: Diagnosis not present

## 2017-12-23 DIAGNOSIS — J432 Centrilobular emphysema: Secondary | ICD-10-CM | POA: Diagnosis not present

## 2017-12-23 DIAGNOSIS — Z87891 Personal history of nicotine dependence: Secondary | ICD-10-CM | POA: Diagnosis not present

## 2017-12-23 DIAGNOSIS — I2510 Atherosclerotic heart disease of native coronary artery without angina pectoris: Secondary | ICD-10-CM | POA: Diagnosis not present

## 2017-12-23 DIAGNOSIS — I10 Essential (primary) hypertension: Secondary | ICD-10-CM | POA: Diagnosis not present

## 2017-12-29 ENCOUNTER — Other Ambulatory Visit: Payer: Self-pay

## 2017-12-29 ENCOUNTER — Emergency Department
Admission: EM | Admit: 2017-12-29 | Discharge: 2017-12-29 | Disposition: A | Discharge status: Home / Self Care | Payer: Medicare Other | Attending: Emergency Medicine | Admitting: Emergency Medicine

## 2017-12-29 ENCOUNTER — Encounter: Payer: Self-pay | Admitting: Emergency Medicine

## 2017-12-29 DIAGNOSIS — I10 Essential (primary) hypertension: Secondary | ICD-10-CM | POA: Insufficient documentation

## 2017-12-29 DIAGNOSIS — I2510 Atherosclerotic heart disease of native coronary artery without angina pectoris: Secondary | ICD-10-CM | POA: Insufficient documentation

## 2017-12-29 DIAGNOSIS — B372 Candidiasis of skin and nail: Secondary | ICD-10-CM | POA: Diagnosis not present

## 2017-12-29 DIAGNOSIS — Z79899 Other long term (current) drug therapy: Secondary | ICD-10-CM | POA: Diagnosis not present

## 2017-12-29 DIAGNOSIS — N183 Chronic kidney disease, stage 3 (moderate): Secondary | ICD-10-CM | POA: Diagnosis not present

## 2017-12-29 DIAGNOSIS — R21 Rash and other nonspecific skin eruption: Secondary | ICD-10-CM | POA: Insufficient documentation

## 2017-12-29 DIAGNOSIS — L732 Hidradenitis suppurativa: Secondary | ICD-10-CM | POA: Diagnosis not present

## 2017-12-29 DIAGNOSIS — F17210 Nicotine dependence, cigarettes, uncomplicated: Secondary | ICD-10-CM | POA: Diagnosis not present

## 2017-12-29 MED ORDER — PREDNISONE 10 MG (21) PO TBPK: ORAL_TABLET | ORAL | 0 refills | Status: DC

## 2017-12-29 MED ORDER — BETAMETHASONE DIPROPIONATE 0.05 % EX CREA: TOPICAL_CREAM | Freq: Two times a day (BID) | CUTANEOUS | 0 refills | Status: AC

## 2017-12-29 MED ORDER — DEXAMETHASONE SODIUM PHOSPHATE 10 MG/ML IJ SOLN
10.0000 mg | Freq: Once | INTRAMUSCULAR | Status: AC
  Administered 2017-12-29: 10 mg via INTRAMUSCULAR
  Filled 2017-12-29: qty 1

## 2017-12-29 NOTE — ED Provider Notes (Signed)
[facility] Emergency Department Provider Note ____________________________________________  Time seen: 1725  I have reviewed the triage vital signs and the nursing notes.  HISTORY  Chief Complaint  Rash  HPI Erika Allen is a 57 y.o. female presents herself to the ED for evaluation of a recurrent rash to the palms of her hands.  She with a history of congestive heart failure, rheumatoid arthritis, Crohn's colitis, and hidradenitis suppurativa, presents with a rash to the hands and intertriginous skin of the between the fingers, for the last 24 hours.  Patient has had a similar rash occur after her IV infusions of Remicade and more recently Stelara.  She is been treated in the past with topical and oral steroids when the rash flares.  She has not had a diagnosis confirmed given her multiple autoimmune & inflammatory disorders.  Previously seen dermatology for HS but no particular biopsies have been performed of the hands.  She has been told that the rash could represent a psoriasis versus a contact dermatitis.  Denies any fevers, chills, chest pain, shortness of breath.  She presents now with dry scaly lesions to the palms and some open cracking, fissuring, and weeping of the lesions between her fingers.  She notes that the areas are itchy.  Past Medical History:  Diagnosis Date  . CAD (coronary artery disease)   . Cardiomyopathy ([place])   . Crohn's colitis ([place])   . Hypertension     There are no active problems to display for this patient.   History reviewed. No pertinent surgical history.  Prior to Admission medications   Medication Sig Start Date End Date Taking? Authorizing Provider  atorvastatin (LIPITOR) 10 MG tablet Take 10 mg by mouth daily at 6 PM.   Yes [provider]  carvedilol (COREG) 25 MG tablet Take 25 mg by mouth 2 (two) times daily with a meal.   Yes [provider]  furosemide (LASIX) 20 MG tablet Take 20 mg by mouth as  needed.   Yes [provider]  losartan (COZAAR) 100 MG tablet Take 100 mg by mouth daily.   Yes [provider]  minocycline (MINOCIN,DYNACIN) 100 MG capsule Take 100 mg by mouth 2 (two) times daily.   Yes [provider]  ustekinumab (STELARA) 90 MG/ML SOSY injection Inject 90 mg into the skin every 8 (eight) weeks.   Yes [provider]  betamethasone dipropionate (DIPROLENE) 0.05 % cream Apply topically 2 (two) times daily. 12/29/17   Alin Hutchins, Dannielle Karvonen, PA-C  OVER THE COUNTER MEDICATION Blood pressure medicine    [provider]  predniSONE (STERAPRED UNI-PAK 21 TAB) 10 MG (21) TBPK tablet 6-day taper as directed. 12/29/17   Cereniti Curb, Dannielle Karvonen, PA-C  Valsartan (DIOVAN PO) Take by mouth.    [provider]    Allergies Amoxicillin; Erythromycin; Flagyl [metronidazole]; and Latex  No family history on file.  Social History Social History   Tobacco Use  . Smoking status: Current Every Day Smoker  . Smokeless tobacco: Never Used  Substance Use Topics  . Alcohol use: No  . Drug use: No    Review of Systems  Constitutional: Negative for fever. Eyes: Negative for visual changes. ENT: Negative for sore throat. Cardiovascular: Negative for chest pain. Respiratory: Negative for shortness of breath. Gastrointestinal: Negative for abdominal pain, vomiting and diarrhea. Genitourinary: Negative for dysuria. Musculoskeletal: Negative for back pain. Skin: Significant for rash. Neurological: Negative for headaches, focal weakness or numbness. ____________________________________________  PHYSICAL EXAM:  VITAL SIGNS: ED Triage Vitals  Enc Vitals Group     BP 12/29/17 1645 (!) 156/76     Pulse Rate 12/29/17 1645 74     Resp 12/29/17 1645 16     Temp 12/29/17 1645 98.3 F (36.8 C)     Temp Source 12/29/17 1645 Oral     SpO2 12/29/17 1645 99 %     Weight 12/29/17 1637 180 lb (81.6 kg)     Height --      Head  Circumference --      Peak Flow --      Pain Score 12/29/17 1637 7     Pain Loc --      Pain Edu? --      Excl. in [place]? --     Constitutional: Alert and oriented. Well appearing and in no distress. Head: Normocephalic and atraumatic. Cardiovascular: Normal rate, regular rhythm. Normal distal pulses. Respiratory: Normal respiratory effort. No wheezes/rales/rhonchi. Musculoskeletal: Nontender with normal range of motion in all extremities.  Neurologic:  Normal gait without ataxia. Normal speech and language. No gross focal neurologic deficits are appreciated. Skin:  Skin is warm, dry and intact.  Patient with scaly, hypertrophic, peeling macular lesions to the palms and intertriginous spaces of the fingers.  The lesions between the fingers show some cracking, fissuring, and weeping.  No induration or erythema is appreciated. ____________________________________________  PROCEDURES  Procedures Decadron 10 mg IM ____________________________________________  INITIAL IMPRESSION / ASSESSMENT AND PLAN / ED COURSE  Patient with ED evaluation of chronic rash to the hands bilaterally.  Patient's symptoms likely represent inflammatory response due to her underlying autoimmune disorders or as a reaction to her IV infusion of Stelara.  Patient will be discharged with a prescription for prednisone and betamethasone cream.  She is to apply the cream as directed and take the taper pack as prescribed.  She will follow-up with her primary provider or dermatologist for ongoing symptom management.  She is encouraged to keep the hands, clean, dry, and highly moisturized. ____________________________________________  FINAL CLINICAL IMPRESSION(S) / ED DIAGNOSES  Final diagnoses:  Rash and nonspecific skin eruption      Timothy Trudell, Dannielle Karvonen, PA-C 12/29/17 2327    Delman Kitten, MD 12/30/17 1039

## 2017-12-29 NOTE — ED Triage Notes (Signed)
Rash to hands and groin x 2 months.

## 2017-12-29 NOTE — Discharge Instructions (Addendum)
Use the topical steroid cream as directed. Take the prescription steroid pills as per the taper scheduled. Keep the hands clean, dry, and moisturized. Consider using petroleum jelly over the steroid cream, and then applying cotton gloves or socks to the hands. Avoid using hydrogen peroxide or harsh soaps and avoid chemical or other irritant exposures. Follow-up with dermatology or your providers as planned.

## 2018-01-14 DIAGNOSIS — R911 Solitary pulmonary nodule: Secondary | ICD-10-CM | POA: Diagnosis not present

## 2018-01-14 DIAGNOSIS — F17210 Nicotine dependence, cigarettes, uncomplicated: Secondary | ICD-10-CM | POA: Diagnosis not present

## 2018-01-16 DIAGNOSIS — K754 Autoimmune hepatitis: Secondary | ICD-10-CM | POA: Diagnosis not present

## 2018-01-16 DIAGNOSIS — E785 Hyperlipidemia, unspecified: Secondary | ICD-10-CM | POA: Diagnosis not present

## 2018-01-16 DIAGNOSIS — Z6836 Body mass index (BMI) 36.0-36.9, adult: Secondary | ICD-10-CM | POA: Diagnosis not present

## 2018-01-16 DIAGNOSIS — E669 Obesity, unspecified: Secondary | ICD-10-CM | POA: Diagnosis not present

## 2018-01-16 DIAGNOSIS — L409 Psoriasis, unspecified: Secondary | ICD-10-CM | POA: Diagnosis not present

## 2018-01-16 DIAGNOSIS — R945 Abnormal results of liver function studies: Secondary | ICD-10-CM | POA: Diagnosis not present

## 2018-01-16 DIAGNOSIS — Z8719 Personal history of other diseases of the digestive system: Secondary | ICD-10-CM | POA: Diagnosis not present

## 2018-01-16 DIAGNOSIS — I10 Essential (primary) hypertension: Secondary | ICD-10-CM | POA: Diagnosis not present

## 2018-01-16 DIAGNOSIS — Z79899 Other long term (current) drug therapy: Secondary | ICD-10-CM | POA: Diagnosis not present

## 2018-01-27 DIAGNOSIS — I10 Essential (primary) hypertension: Secondary | ICD-10-CM | POA: Diagnosis not present

## 2018-01-27 DIAGNOSIS — I429 Cardiomyopathy, unspecified: Secondary | ICD-10-CM | POA: Diagnosis not present

## 2018-01-27 DIAGNOSIS — I2510 Atherosclerotic heart disease of native coronary artery without angina pectoris: Secondary | ICD-10-CM | POA: Diagnosis not present

## 2018-01-27 DIAGNOSIS — L732 Hidradenitis suppurativa: Secondary | ICD-10-CM | POA: Diagnosis not present

## 2018-01-27 DIAGNOSIS — J432 Centrilobular emphysema: Secondary | ICD-10-CM | POA: Diagnosis not present

## 2018-01-27 DIAGNOSIS — Z720 Tobacco use: Secondary | ICD-10-CM | POA: Diagnosis not present

## 2018-01-27 DIAGNOSIS — N183 Chronic kidney disease, stage 3 (moderate): Secondary | ICD-10-CM | POA: Diagnosis not present

## 2018-01-27 DIAGNOSIS — Z87891 Personal history of nicotine dependence: Secondary | ICD-10-CM | POA: Diagnosis not present

## 2018-01-27 DIAGNOSIS — R21 Rash and other nonspecific skin eruption: Secondary | ICD-10-CM | POA: Diagnosis not present

## 2018-01-27 DIAGNOSIS — E669 Obesity, unspecified: Secondary | ICD-10-CM | POA: Diagnosis not present

## 2018-02-03 DIAGNOSIS — Z713 Dietary counseling and surveillance: Secondary | ICD-10-CM | POA: Diagnosis not present

## 2018-02-03 DIAGNOSIS — N183 Chronic kidney disease, stage 3 (moderate): Secondary | ICD-10-CM | POA: Diagnosis not present

## 2018-02-03 DIAGNOSIS — Z6834 Body mass index (BMI) 34.0-34.9, adult: Secondary | ICD-10-CM | POA: Diagnosis not present

## 2018-02-18 DIAGNOSIS — F17210 Nicotine dependence, cigarettes, uncomplicated: Secondary | ICD-10-CM | POA: Diagnosis not present

## 2018-02-18 DIAGNOSIS — R911 Solitary pulmonary nodule: Secondary | ICD-10-CM | POA: Diagnosis not present

## 2018-02-18 DIAGNOSIS — R918 Other nonspecific abnormal finding of lung field: Secondary | ICD-10-CM | POA: Diagnosis not present

## 2018-02-18 DIAGNOSIS — D219 Benign neoplasm of connective and other soft tissue, unspecified: Secondary | ICD-10-CM | POA: Diagnosis not present

## 2018-02-19 DIAGNOSIS — F419 Anxiety disorder, unspecified: Secondary | ICD-10-CM | POA: Diagnosis not present

## 2018-02-19 DIAGNOSIS — L732 Hidradenitis suppurativa: Secondary | ICD-10-CM | POA: Diagnosis not present

## 2018-02-19 DIAGNOSIS — R918 Other nonspecific abnormal finding of lung field: Secondary | ICD-10-CM | POA: Diagnosis not present

## 2018-02-19 DIAGNOSIS — F17210 Nicotine dependence, cigarettes, uncomplicated: Secondary | ICD-10-CM | POA: Diagnosis not present

## 2018-02-19 DIAGNOSIS — Z85118 Personal history of other malignant neoplasm of bronchus and lung: Secondary | ICD-10-CM | POA: Diagnosis not present

## 2018-02-19 DIAGNOSIS — I428 Other cardiomyopathies: Secondary | ICD-10-CM | POA: Diagnosis not present

## 2018-02-19 DIAGNOSIS — I10 Essential (primary) hypertension: Secondary | ICD-10-CM | POA: Diagnosis not present

## 2018-02-19 DIAGNOSIS — I2510 Atherosclerotic heart disease of native coronary artery without angina pectoris: Secondary | ICD-10-CM | POA: Diagnosis not present

## 2018-02-19 DIAGNOSIS — F329 Major depressive disorder, single episode, unspecified: Secondary | ICD-10-CM | POA: Diagnosis not present

## 2018-02-19 DIAGNOSIS — E785 Hyperlipidemia, unspecified: Secondary | ICD-10-CM | POA: Diagnosis not present

## 2018-02-19 DIAGNOSIS — J432 Centrilobular emphysema: Secondary | ICD-10-CM | POA: Diagnosis not present

## 2018-02-19 DIAGNOSIS — Z79899 Other long term (current) drug therapy: Secondary | ICD-10-CM | POA: Diagnosis not present

## 2018-02-24 DIAGNOSIS — I2510 Atherosclerotic heart disease of native coronary artery without angina pectoris: Secondary | ICD-10-CM | POA: Diagnosis not present

## 2018-02-24 DIAGNOSIS — I10 Essential (primary) hypertension: Secondary | ICD-10-CM | POA: Diagnosis not present

## 2018-02-24 DIAGNOSIS — J432 Centrilobular emphysema: Secondary | ICD-10-CM | POA: Diagnosis not present

## 2018-02-24 DIAGNOSIS — Z87891 Personal history of nicotine dependence: Secondary | ICD-10-CM | POA: Diagnosis not present

## 2018-03-19 DIAGNOSIS — Z8719 Personal history of other diseases of the digestive system: Secondary | ICD-10-CM | POA: Diagnosis not present

## 2018-03-19 DIAGNOSIS — L403 Pustulosis palmaris et plantaris: Secondary | ICD-10-CM | POA: Diagnosis not present

## 2018-03-19 DIAGNOSIS — L732 Hidradenitis suppurativa: Secondary | ICD-10-CM | POA: Diagnosis not present

## 2018-03-19 DIAGNOSIS — Z1329 Encounter for screening for other suspected endocrine disorder: Secondary | ICD-10-CM | POA: Diagnosis not present

## 2018-03-19 DIAGNOSIS — L304 Erythema intertrigo: Secondary | ICD-10-CM | POA: Diagnosis not present

## 2018-03-19 DIAGNOSIS — R748 Abnormal levels of other serum enzymes: Secondary | ICD-10-CM | POA: Diagnosis not present

## 2018-03-19 DIAGNOSIS — Z130 Encounter for screening for diseases of the blood and blood-forming organs and certain disorders involving the immune mechanism: Secondary | ICD-10-CM | POA: Diagnosis not present

## 2018-03-19 DIAGNOSIS — Z13228 Encounter for screening for other metabolic disorders: Secondary | ICD-10-CM | POA: Diagnosis not present

## 2018-03-19 DIAGNOSIS — Z79899 Other long term (current) drug therapy: Secondary | ICD-10-CM | POA: Diagnosis not present

## 2018-03-20 DIAGNOSIS — I10 Essential (primary) hypertension: Secondary | ICD-10-CM | POA: Diagnosis not present

## 2018-03-20 DIAGNOSIS — Z79899 Other long term (current) drug therapy: Secondary | ICD-10-CM | POA: Diagnosis not present

## 2018-03-20 DIAGNOSIS — F17210 Nicotine dependence, cigarettes, uncomplicated: Secondary | ICD-10-CM | POA: Diagnosis not present

## 2018-03-20 DIAGNOSIS — K50113 Crohn's disease of large intestine with fistula: Secondary | ICD-10-CM | POA: Diagnosis not present

## 2018-03-20 DIAGNOSIS — L732 Hidradenitis suppurativa: Secondary | ICD-10-CM | POA: Diagnosis not present

## 2018-03-31 DIAGNOSIS — I10 Essential (primary) hypertension: Secondary | ICD-10-CM | POA: Diagnosis not present

## 2018-03-31 DIAGNOSIS — Z87891 Personal history of nicotine dependence: Secondary | ICD-10-CM | POA: Diagnosis not present

## 2018-03-31 DIAGNOSIS — J432 Centrilobular emphysema: Secondary | ICD-10-CM | POA: Diagnosis not present

## 2018-03-31 DIAGNOSIS — I2510 Atherosclerotic heart disease of native coronary artery without angina pectoris: Secondary | ICD-10-CM | POA: Diagnosis not present

## 2018-04-01 DIAGNOSIS — K7589 Other specified inflammatory liver diseases: Secondary | ICD-10-CM | POA: Diagnosis not present

## 2018-04-01 DIAGNOSIS — K739 Chronic hepatitis, unspecified: Secondary | ICD-10-CM | POA: Diagnosis not present

## 2018-04-01 DIAGNOSIS — K7689 Other specified diseases of liver: Secondary | ICD-10-CM | POA: Diagnosis not present

## 2018-04-01 DIAGNOSIS — R945 Abnormal results of liver function studies: Secondary | ICD-10-CM | POA: Diagnosis not present

## 2018-04-01 DIAGNOSIS — R748 Abnormal levels of other serum enzymes: Secondary | ICD-10-CM | POA: Diagnosis not present

## 2018-04-01 DIAGNOSIS — K7290 Hepatic failure, unspecified without coma: Secondary | ICD-10-CM | POA: Diagnosis not present

## 2018-04-03 DIAGNOSIS — I429 Cardiomyopathy, unspecified: Secondary | ICD-10-CM | POA: Diagnosis not present

## 2018-04-30 DIAGNOSIS — I2510 Atherosclerotic heart disease of native coronary artery without angina pectoris: Secondary | ICD-10-CM | POA: Diagnosis not present

## 2018-04-30 DIAGNOSIS — J432 Centrilobular emphysema: Secondary | ICD-10-CM | POA: Diagnosis not present

## 2018-04-30 DIAGNOSIS — Z87891 Personal history of nicotine dependence: Secondary | ICD-10-CM | POA: Diagnosis not present

## 2018-04-30 DIAGNOSIS — I10 Essential (primary) hypertension: Secondary | ICD-10-CM | POA: Diagnosis not present

## 2018-05-01 DIAGNOSIS — R945 Abnormal results of liver function studies: Secondary | ICD-10-CM | POA: Diagnosis not present

## 2019-03-13 IMAGING — DX DG CHEST 2V
2 series · 2 of 2 positions shown · non-contrast
Comparison: None.

CLINICAL DATA: Congestive heart failure.  Shortness of breath

EXAM:
CHEST  2 VIEW

[chest pa]
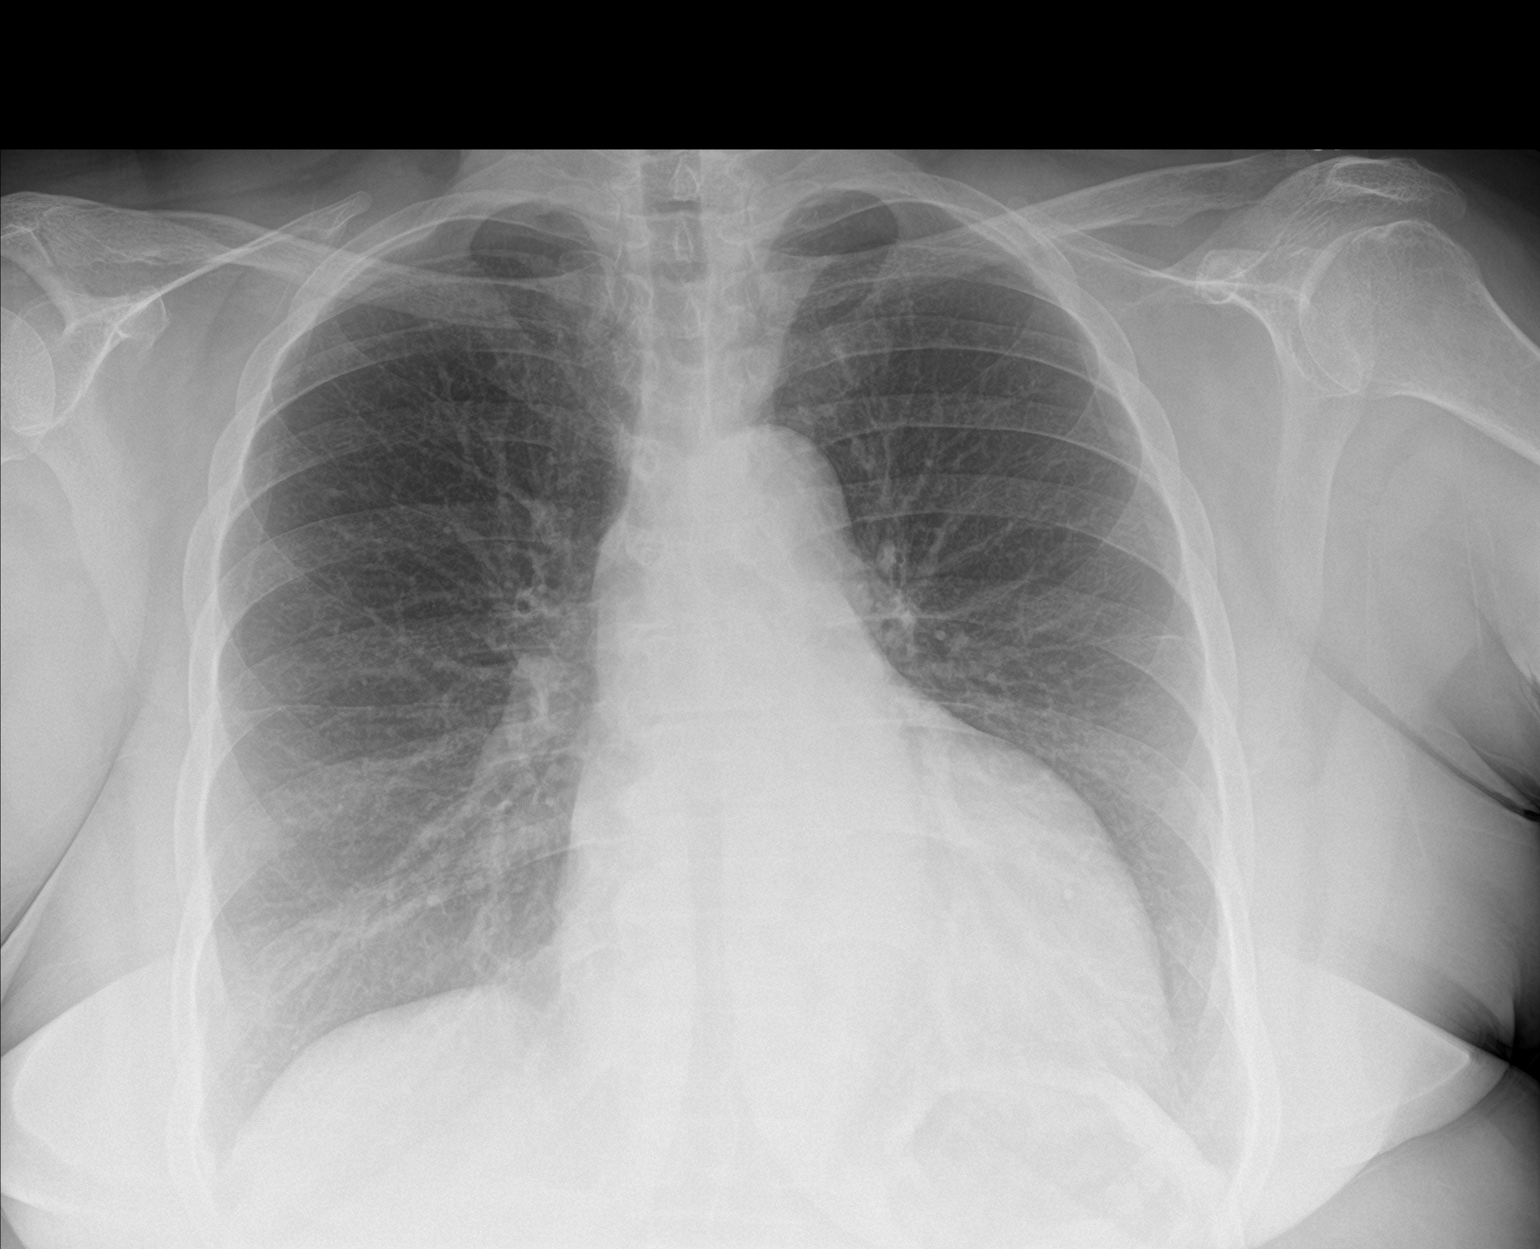

[chest lat]
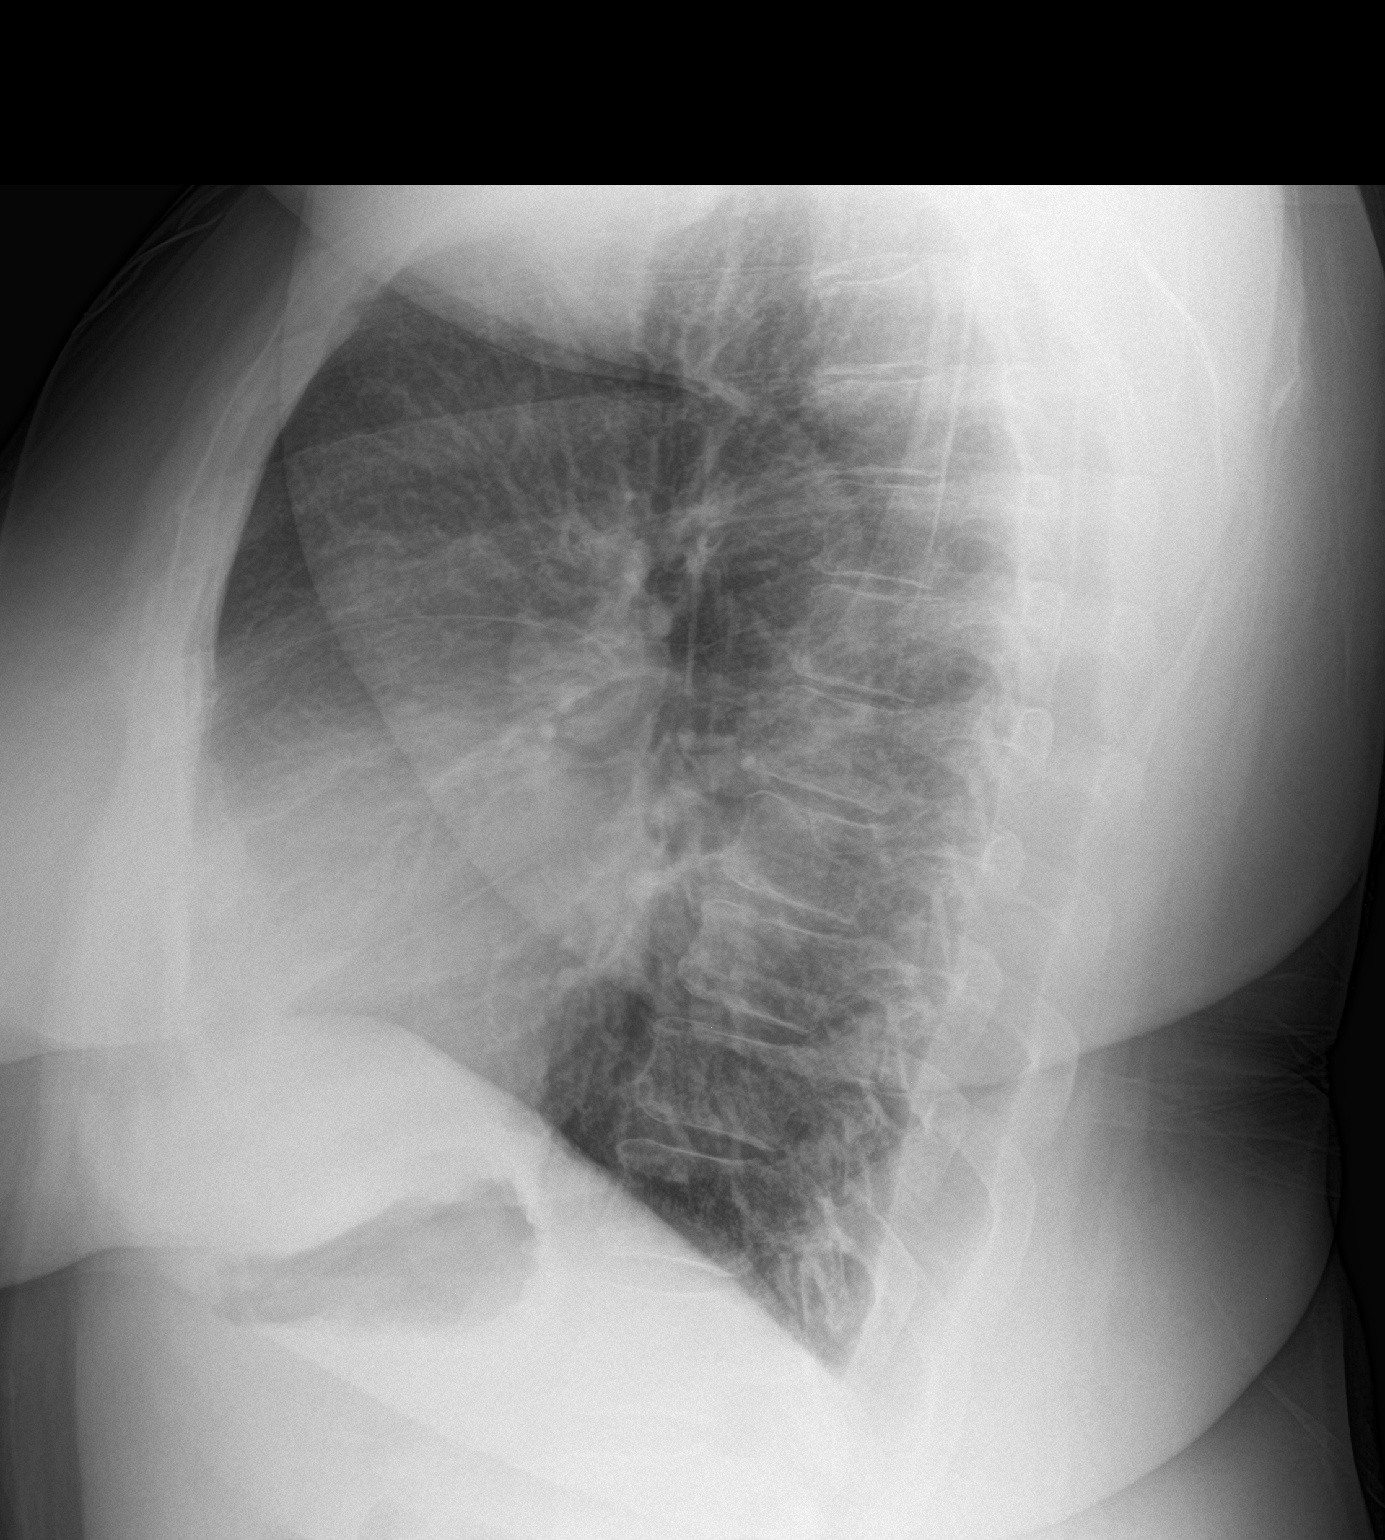

[2 of 2 positions shown; findings below may reference images not displayed]

FINDINGS: Cardiomegaly without edema or effusion. Mild aortic tortuosity.
Negative hila. No air bronchogram or pneumothorax. No osseous
findings.
IMPRESSION: Cardiomegaly without edema.

## 2020-09-27 ENCOUNTER — Other Ambulatory Visit: Payer: Self-pay

## 2020-09-27 ENCOUNTER — Emergency Department
Admission: EM | Admit: 2020-09-27 | Discharge: 2020-09-27 | Disposition: A | Discharge status: Home / Self Care | Payer: Medicare Other | Attending: Emergency Medicine | Admitting: Emergency Medicine

## 2020-09-27 ENCOUNTER — Emergency Department: Payer: Medicare Other

## 2020-09-27 DIAGNOSIS — Z91040 Latex allergy status: Secondary | ICD-10-CM | POA: Insufficient documentation

## 2020-09-27 DIAGNOSIS — Z79899 Other long term (current) drug therapy: Secondary | ICD-10-CM | POA: Diagnosis not present

## 2020-09-27 DIAGNOSIS — R059 Cough, unspecified: Secondary | ICD-10-CM | POA: Diagnosis present

## 2020-09-27 DIAGNOSIS — I2510 Atherosclerotic heart disease of native coronary artery without angina pectoris: Secondary | ICD-10-CM | POA: Diagnosis not present

## 2020-09-27 DIAGNOSIS — J40 Bronchitis, not specified as acute or chronic: Secondary | ICD-10-CM | POA: Diagnosis not present

## 2020-09-27 DIAGNOSIS — I10 Essential (primary) hypertension: Secondary | ICD-10-CM | POA: Diagnosis not present

## 2020-09-27 DIAGNOSIS — F17200 Nicotine dependence, unspecified, uncomplicated: Secondary | ICD-10-CM | POA: Diagnosis not present

## 2020-09-27 MED ORDER — ALBUTEROL SULFATE HFA 108 (90 BASE) MCG/ACT IN AERS: 2.0000 | INHALATION_SPRAY | RESPIRATORY_TRACT | 0 refills | Status: AC | PRN

## 2020-09-27 MED ORDER — PREDNISONE 50 MG PO TABS: 50.0000 mg | ORAL_TABLET | Freq: Every day | ORAL | 0 refills | Status: AC

## 2020-09-27 MED ORDER — PSEUDOEPH-BROMPHEN-DM 30-2-10 MG/5ML PO SYRP: 10.0000 mL | ORAL_SOLUTION | Freq: Four times a day (QID) | ORAL | 0 refills | Status: AC | PRN

## 2020-09-27 MED ORDER — BENZONATATE 100 MG PO CAPS: 100.0000 mg | ORAL_CAPSULE | Freq: Four times a day (QID) | ORAL | 0 refills | Status: AC | PRN

## 2020-09-27 NOTE — ED Triage Notes (Signed)
Pt comes with c/o cough, congestion for about 4 weeks. Pt states she got better and then it hit her again. Pt states sneezing.

## 2020-09-27 NOTE — ED Provider Notes (Signed)
[facility] Emergency Department Provider Note  ____________________________________________  Time seen: Approximately 3:17 PM  I have reviewed the triage vital signs and the nursing notes.   HISTORY  Chief Complaint Cough    HPI Erika Allen is a 60 y.o. female who presents the emergency department complaining of nasal congestion, sneezing and cough.  Patient states that approximately a month ago she started with significant nasal congestion and sneezing.  Initially she thought it was the pollen and her allergies.  Patient seemed to improve with the symptoms, then developed a productive cough.  This been going on for 7 to 10 days.  No associated fevers or chills.  Patient still has nasal congestion.  Today she states that she was out in the heat and started feeling short of breath during a coughing spell and that prompted her to come to the emergency department.  No chest pain, abdominal pain.  She is tried multiple over-the-counter medications with no symptom relief.         Past Medical History:  Diagnosis Date  . CAD (coronary artery disease)   . Cardiomyopathy ([place])   . Crohn's colitis ([place])   . Hypertension     There are no problems to display for this patient.   History reviewed. No pertinent surgical history.  Prior to Admission medications   Medication Sig Start Date End Date Taking? Authorizing Provider  albuterol (VENTOLIN HFA) 108 (90 Base) MCG/ACT inhaler Inhale 2 puffs into the lungs every 4 (four) hours as needed for wheezing or shortness of breath. 09/27/20  Yes Elcie Pelster, Charline Bills, PA-C  benzonatate (TESSALON PERLES) 100 MG capsule Take 1 capsule (100 mg total) by mouth every 6 (six) hours as needed for cough. 09/27/20 09/27/21 Yes Kirsten Mckone, Charline Bills, PA-C  brompheniramine-pseudoephedrine-DM 30-2-10 MG/5ML syrup Take 10 mLs by mouth 4 (four) times daily as needed. 09/27/20  Yes Hiroyuki Ozanich, Charline Bills, PA-C  predniSONE (DELTASONE) 50 MG  tablet Take 1 tablet (50 mg total) by mouth daily with breakfast. 09/27/20  Yes Darnell Jeschke, Charline Bills, PA-C  atorvastatin (LIPITOR) 10 MG tablet Take 10 mg by mouth daily at 6 PM.    [provider]  betamethasone dipropionate (DIPROLENE) 0.05 % cream Apply topically 2 (two) times daily. 12/29/17   Menshew, Dannielle Karvonen, PA-C  carvedilol (COREG) 25 MG tablet Take 25 mg by mouth 2 (two) times daily with a meal.    [provider]  furosemide (LASIX) 20 MG tablet Take 20 mg by mouth as needed.    [provider]  losartan (COZAAR) 100 MG tablet Take 100 mg by mouth daily.    [provider]  minocycline (MINOCIN,DYNACIN) 100 MG capsule Take 100 mg by mouth 2 (two) times daily.    [provider]  OVER THE COUNTER MEDICATION Blood pressure medicine    [provider]  ustekinumab (STELARA) 90 MG/ML SOSY injection Inject 90 mg into the skin every 8 (eight) weeks.    [provider]  Valsartan (DIOVAN PO) Take by mouth.    [provider]    Allergies Amoxicillin, Erythromycin, Flagyl [metronidazole], and Latex  No family history on file.  Social History Social History   Tobacco Use  . Smoking status: Current Every Day Smoker  . Smokeless tobacco: Never Used  Substance Use Topics  . Alcohol use: No  . Drug use: No     Review of Systems  Constitutional: No fever/chills Eyes: No visual changes. No discharge ENT: No upper respiratory  complaints. Cardiovascular: no chest pain. Respiratory: no cough. No SOB. Gastrointestinal: No abdominal pain.  No nausea, no vomiting.  No diarrhea.  No constipation. Musculoskeletal: Negative for musculoskeletal pain. Skin: Negative for rash, abrasions, lacerations, ecchymosis. Neurological: Negative for headaches, focal weakness or numbness.  10 System ROS otherwise negative.  ____________________________________________   PHYSICAL EXAM:  VITAL SIGNS: ED Triage Vitals  Enc  Vitals Group     BP 09/27/20 1344 (!) 114/51     Pulse Rate 09/27/20 1344 72     Resp 09/27/20 1344 19     Temp 09/27/20 1344 98.5 F (36.9 C)     Temp Source 09/27/20 1344 Oral     SpO2 09/27/20 1344 98 %     Weight 09/27/20 1502 180 lb 12.4 oz (82 kg)     Height 09/27/20 1502 5\' 4"  (1.626 m)     Head Circumference --      Peak Flow --      Pain Score 09/27/20 1342 0     Pain Loc --      Pain Edu? --      Excl. in [place]? --      Constitutional: Alert and oriented. Well appearing and in no acute distress. Eyes: Conjunctivae are normal. PERRL. EOMI. Head: Atraumatic. ENT:      Ears:       Nose: No congestion/rhinnorhea.      Mouth/Throat: Mucous membranes are moist.  Neck: No stridor.    Cardiovascular: Normal rate, regular rhythm. Normal S1 and S2.  Good peripheral circulation. Respiratory: Normal respiratory effort without tachypnea or retractions. Lungs CTAB with no wheezing, rales or rhonchi.  No increased work of breathing.Kermit Balo air entry to the bases with no decreased or absent breath sounds. Musculoskeletal: Full range of motion to all extremities. No gross deformities appreciated. Neurologic:  Normal speech and language. No gross focal neurologic deficits are appreciated.  Skin:  Skin is warm, dry and intact. No rash noted. Psychiatric: Mood and affect are normal. Speech and behavior are normal. Patient exhibits appropriate insight and judgement.   ____________________________________________   LABS (all labs ordered are listed, but only abnormal results are displayed)  Labs Reviewed - No data to display ____________________________________________  EKG   ____________________________________________  RADIOLOGY I personally viewed and evaluated these images as part of my medical decision making, as well as reviewing the written report by the radiologist.  ED Provider Interpretation:   DG Chest 2 View  Result Date: 09/27/2020 CLINICAL DATA:  Cough for 1 month  EXAM: CHEST - 2 VIEW COMPARISON:  08/29/2016 FINDINGS: Hyperinflation without focal opacity or pleural effusion. Normal cardiomediastinal silhouette. No pneumothorax. IMPRESSION: No active cardiopulmonary disease. Electronically Signed   By: Donavan Foil M.D.   On: 09/27/2020 16:07    ____________________________________________    PROCEDURES  Procedure(s) performed:    Procedures    Medications - No data to display   ____________________________________________   INITIAL IMPRESSION / ASSESSMENT AND PLAN / ED COURSE  Pertinent labs & imaging results that were available during my care of the patient were reviewed by me and considered in my medical decision making (see chart for details).  Review of the [place] CSRS was performed in accordance of the [place] prior to dispensing any controlled drugs.           Patient's diagnosis is consistent with bronchitis.  Patient presented to the emergency department complaining of cough.  Patient had nasal congestion and sneezing for 3 weeks, then started with  a cough for 7 to 10 days.  No fevers or chills.  Cough was productive.  Differential included bronchitis, pneumonia, virus, COVID-19.  At this time chest x-ray was reassuring with no evidence of pneumonia.  At this time there is no indication for further work-up.  Patient remained stable in the emergency department and will be discharged with prednisone, albuterol, Bromfed cough syrup and Tessalon Perles.  Follow-up primary care as needed..  Patient is given ED precautions to return to the ED for any worsening or new symptoms.     ____________________________________________  FINAL CLINICAL IMPRESSION(S) / ED DIAGNOSES  Final diagnoses:  Bronchitis      NEW MEDICATIONS STARTED DURING THIS VISIT:  ED Discharge Orders         Ordered    predniSONE (DELTASONE) 50 MG tablet  Daily with breakfast        09/27/20 1643    brompheniramine-pseudoephedrine-DM 30-2-10 MG/5ML syrup  4  times daily PRN        09/27/20 1643    benzonatate (TESSALON PERLES) 100 MG capsule  Every 6 hours PRN        09/27/20 1643    albuterol (VENTOLIN HFA) 108 (90 Base) MCG/ACT inhaler  Every 4 hours PRN        09/27/20 1643              This chart was dictated using voice recognition software/Dragon. Despite best efforts to proofread, errors can occur which can change the meaning. Any change was purely unintentional.    Darletta Moll, PA-C 09/27/20 1644    Duffy Bruce, MD 10/03/20 2056

## 2020-09-27 NOTE — ED Notes (Signed)
Patient reports cold symptoms x 1 month. Patient reports worsening cough this week, with no improvement after OTC treatment.

## 2020-10-19 ENCOUNTER — Emergency Department: Payer: Medicare Other

## 2020-10-19 ENCOUNTER — Other Ambulatory Visit: Payer: Self-pay

## 2020-10-19 ENCOUNTER — Emergency Department
Admission: EM | Admit: 2020-10-19 | Discharge: 2020-10-19 | Disposition: A | Discharge status: Home / Self Care | Payer: Medicare Other | Attending: Emergency Medicine | Admitting: Emergency Medicine

## 2020-10-19 DIAGNOSIS — J441 Chronic obstructive pulmonary disease with (acute) exacerbation: Secondary | ICD-10-CM | POA: Diagnosis not present

## 2020-10-19 DIAGNOSIS — R0602 Shortness of breath: Secondary | ICD-10-CM | POA: Diagnosis present

## 2020-10-19 DIAGNOSIS — I2510 Atherosclerotic heart disease of native coronary artery without angina pectoris: Secondary | ICD-10-CM | POA: Diagnosis not present

## 2020-10-19 DIAGNOSIS — Z20822 Contact with and (suspected) exposure to covid-19: Secondary | ICD-10-CM | POA: Diagnosis not present

## 2020-10-19 DIAGNOSIS — I10 Essential (primary) hypertension: Secondary | ICD-10-CM | POA: Insufficient documentation

## 2020-10-19 DIAGNOSIS — F17210 Nicotine dependence, cigarettes, uncomplicated: Secondary | ICD-10-CM | POA: Insufficient documentation

## 2020-10-19 DIAGNOSIS — Z79899 Other long term (current) drug therapy: Secondary | ICD-10-CM | POA: Diagnosis not present

## 2020-10-19 DIAGNOSIS — Z91040 Latex allergy status: Secondary | ICD-10-CM | POA: Diagnosis not present

## 2020-10-19 LAB — CBC WITH DIFFERENTIAL/PLATELET
Abs Immature Granulocytes: 0.02 10*3/uL (ref 0.00–0.07)
Basophils Absolute: 0 10*3/uL (ref 0.0–0.1)
Basophils Relative: 0 %
Eosinophils Absolute: 0 10*3/uL (ref 0.0–0.5)
Eosinophils Relative: 0 %
HCT: 39.4 % (ref 36.0–46.0)
Hemoglobin: 13.9 g/dL (ref 12.0–15.0)
Immature Granulocytes: 0 %
Lymphocytes Relative: 20 %
Lymphs Abs: 1.4 10*3/uL (ref 0.7–4.0)
MCH: 33.3 pg (ref 26.0–34.0)
MCHC: 35.3 g/dL (ref 30.0–36.0)
MCV: 94.3 fL (ref 80.0–100.0)
Monocytes Absolute: 0.3 10*3/uL (ref 0.1–1.0)
Monocytes Relative: 4 %
Neutro Abs: 5.2 10*3/uL (ref 1.7–7.7)
Neutrophils Relative %: 76 %
Platelets: 265 10*3/uL (ref 150–400)
RBC: 4.18 MIL/uL (ref 3.87–5.11)
RDW: 14.3 % (ref 11.5–15.5)
WBC: 6.9 10*3/uL (ref 4.0–10.5)
nRBC: 0 % (ref 0.0–0.2)

## 2020-10-19 LAB — BASIC METABOLIC PANEL
Anion gap: 7 (ref 5–15)
BUN: 17 mg/dL (ref 8–23)
CO2: 24 mmol/L (ref 22–32)
Calcium: 9.3 mg/dL (ref 8.9–10.3)
Chloride: 105 mmol/L (ref 98–111)
Creatinine, Ser: 1.2 mg/dL — ABNORMAL HIGH (ref 0.44–1.00)
GFR, Estimated: 51 mL/min — ABNORMAL LOW (ref >60)
Glucose, Bld: 150 mg/dL — ABNORMAL HIGH (ref 70–99)
Potassium: 4.4 mmol/L (ref 3.5–5.1)
Sodium: 136 mmol/L (ref 135–145)

## 2020-10-19 LAB — TROPONIN I (HIGH SENSITIVITY)
Troponin I (High Sensitivity): 9 ng/L (ref <18)
Troponin I (High Sensitivity): 14 ng/L (ref <18)

## 2020-10-19 LAB — BRAIN NATRIURETIC PEPTIDE: B Natriuretic Peptide: 28.7 pg/mL (ref 0.0–100.0)

## 2020-10-19 LAB — RESP PANEL BY RT-PCR (FLU A&B, COVID) ARPGX2
Influenza A by PCR: NEGATIVE
Influenza B by PCR: NEGATIVE
SARS Coronavirus 2 by RT PCR: NEGATIVE

## 2020-10-19 LAB — D-DIMER, QUANTITATIVE: D-Dimer, Quant: 0.48 ug/mL-FEU (ref 0.00–0.50)

## 2020-10-19 MED ORDER — COMPRESSOR/NEBULIZER MISC: 1.0000 [IU] | Freq: Once | 0 refills | Status: AC

## 2020-10-19 MED ORDER — ONDANSETRON HCL 4 MG/2ML IJ SOLN
4.0000 mg | Freq: Once | INTRAMUSCULAR | Status: AC
  Administered 2020-10-19: 4 mg via INTRAVENOUS
  Filled 2020-10-19: qty 2

## 2020-10-19 MED ORDER — IPRATROPIUM-ALBUTEROL 0.5-2.5 (3) MG/3ML IN SOLN
3.0000 mL | Freq: Once | RESPIRATORY_TRACT | Status: AC
  Administered 2020-10-19: 3 mL via RESPIRATORY_TRACT
  Filled 2020-10-19: qty 3

## 2020-10-19 MED ORDER — ALBUTEROL SULFATE (2.5 MG/3ML) 0.083% IN NEBU: 2.5000 mg | INHALATION_SOLUTION | RESPIRATORY_TRACT | 2 refills | Status: AC | PRN

## 2020-10-19 NOTE — ED Triage Notes (Signed)
Pt presents from home via ems for sob. Started this morning per pt. O2 sat was 100% on room air with diminished lung sounds when ems arrived on scene. She was unable to speak in full sentences initially. They administered 2 duonebs and pt is able to speak in short sentences upon arrival to ED, oxygen sat 98% on room air.   Hx of copd, chron's, hyperlipidemia, htn, chf. Denies taking meds for chf, no edema present. Does not use oxygen on a continuous basis.

## 2020-10-19 NOTE — ED Provider Notes (Signed)
[facility] Emergency Department Provider Note ____________________________________________   Event Date/Time   First MD Initiated Contact with Patient 10/19/20 1112     (approximate)  I have reviewed the triage vital signs and the nursing notes.   HISTORY  Chief Complaint Shortness of Breath  HPI Erika Allen is a 60 y.o. female with history of CAD, Cardiomyopathy, Hypertension, pulmonary nodules, cigarette smoker presents to the emergency department for treatment and evaluation of shortness of breath that started this morning when getting up to use the bathroom. On EMS arrival, she was only using 1-2 words/sentence. 2 Duonebs given enroute. Breath sounds remain diminished throughout. She was recently treated for bronchitis with steroids and albuterol.         Past Medical History:  Diagnosis Date   CAD (coronary artery disease)    Cardiomyopathy ([place])    Crohn's colitis ([place])    Hypertension     There are no problems to display for this patient.   History reviewed. No pertinent surgical history.  Prior to Admission medications   Medication Sig Start Date End Date Taking? Authorizing Provider  albuterol (PROVENTIL) (2.5 MG/3ML) 0.083% nebulizer solution Take 3 mLs (2.5 mg total) by nebulization every 4 (four) hours as needed for wheezing or shortness of breath. 10/19/20 10/19/21 Yes Naaman Plummer, MD  Nebulizers (COMPRESSOR/NEBULIZER) MISC 1 Units by Does not apply route once for 1 dose. 10/19/20 10/19/20 Yes Bradler, Vista Lawman, MD  albuterol (VENTOLIN HFA) 108 (90 Base) MCG/ACT inhaler Inhale 2 puffs into the lungs every 4 (four) hours as needed for wheezing or shortness of breath. 09/27/20   Cuthriell, Charline Bills, PA-C  atorvastatin (LIPITOR) 10 MG tablet Take 10 mg by mouth daily at 6 PM.    [provider]  benzonatate (TESSALON PERLES) 100 MG capsule Take 1 capsule (100 mg total) by mouth every 6 (six) hours as needed for cough. 09/27/20  09/27/21  Cuthriell, Charline Bills, PA-C  betamethasone dipropionate (DIPROLENE) 0.05 % cream Apply topically 2 (two) times daily. 12/29/17   Menshew, Dannielle Karvonen, PA-C  brompheniramine-pseudoephedrine-DM 30-2-10 MG/5ML syrup Take 10 mLs by mouth 4 (four) times daily as needed. 09/27/20   Cuthriell, Charline Bills, PA-C  carvedilol (COREG) 25 MG tablet Take 25 mg by mouth 2 (two) times daily with a meal.    [provider]  furosemide (LASIX) 20 MG tablet Take 20 mg by mouth as needed.    [provider]  losartan (COZAAR) 100 MG tablet Take 100 mg by mouth daily.    [provider]  minocycline (MINOCIN,DYNACIN) 100 MG capsule Take 100 mg by mouth 2 (two) times daily.    [provider]  OVER THE COUNTER MEDICATION Blood pressure medicine    [provider]  predniSONE (DELTASONE) 50 MG tablet Take 1 tablet (50 mg total) by mouth daily with breakfast. 09/27/20   Cuthriell, Charline Bills, PA-C  ustekinumab (STELARA) 90 MG/ML SOSY injection Inject 90 mg into the skin every 8 (eight) weeks.    [provider]  Valsartan (DIOVAN PO) Take by mouth.    [provider]    Allergies Amoxicillin, Erythromycin, Flagyl [metronidazole], and Latex  History reviewed. No pertinent family history.  Social History Social History   Tobacco Use   Smoking status: Every Day    Pack years: 0.00   Smokeless tobacco: Never  Substance Use Topics   Alcohol use: No   Drug use: No    Review of Systems  Constitutional: No fever/chills Eyes: No visual changes. ENT: No sore throat. Cardiovascular: Denies chest pain. Respiratory: Positive for shortness of breath. Gastrointestinal: No abdominal pain.  No nausea, no vomiting.  No diarrhea.  No constipation. Genitourinary: Negative for dysuria. Musculoskeletal: Negative for back pain. Skin: Negative for rash. Neurological: Negative for headaches, focal weakness or  numbness. ____________________________________________   PHYSICAL EXAM:  Today's Vitals   10/19/20 1117 10/19/20 1130 10/19/20 1230 10/19/20 1330  BP: 122/69 107/76 91/77 118/66  Pulse: 73 63 (!) 54 (!) 53  Resp: 19 (!) 22 19 18   Temp: 97.9 F (36.6 C)     TempSrc: Oral     SpO2: 97% 97% 96% 93%  Weight: 103.4 kg     Height: 5\' 4"  (1.626 m)     PainSc: 0-No pain   0-No pain   Body mass index is 39.14 kg/m.  Constitutional: Alert and oriented. Well appearing and in no acute distress. Eyes: Conjunctivae are normal. PERRL. EOMI. Head: Atraumatic. Nose: No congestion/rhinnorhea. Mouth/Throat: Mucous membranes are moist.  Oropharynx non-erythematous. Neck: No stridor.   Hematological/Lymphatic/Immunilogical: No cervical lymphadenopathy. Cardiovascular: Normal rate, regular rhythm. Grossly normal heart sounds.  Good peripheral circulation. Respiratory: Normal respiratory effort.  No retractions. Lungs CTAB. Gastrointestinal: Soft and nontender. No distention. No abdominal bruits. No CVA tenderness. Genitourinary:  Musculoskeletal: No lower extremity tenderness nor edema.  No joint effusions. Neurologic:  Normal speech and language. No gross focal neurologic deficits are appreciated. No gait instability. Skin:  Skin is warm, dry and intact. No rash noted. Psychiatric: Mood and affect are normal. Speech and behavior are normal.  ____________________________________________   LABS (all labs ordered are listed, but only abnormal results are displayed)  Labs Reviewed  BASIC METABOLIC PANEL - Abnormal; Notable for the following components:      Result Value   Glucose, Bld 150 (*)    Creatinine, Ser 1.20 (*)    GFR, Estimated 51 (*)    All other components within normal limits  RESP PANEL BY RT-PCR (FLU A&B, COVID) ARPGX2  CBC WITH DIFFERENTIAL/PLATELET  BRAIN NATRIURETIC PEPTIDE  D-DIMER, QUANTITATIVE  TROPONIN I (HIGH SENSITIVITY)  TROPONIN I (HIGH SENSITIVITY)    ____________________________________________  EKG  Pending ____________________________________________  RADIOLOGY  ED MD interpretation:    Pending I, Sherrie George, personally viewed and evaluated these images (plain radiographs) as part of my medical decision making, as well as reviewing the written report by the radiologist.  Official radiology report(s): DG Chest Port 1 View  Result Date: 10/19/2020 CLINICAL DATA:  Shortness of breath EXAM: PORTABLE CHEST 1 VIEW COMPARISON:  Sep 27, 2020 FINDINGS: Lungs are clear. Heart size and pulmonary vascularity are normal. No adenopathy. No bone lesions. There is aortic atherosclerosis. IMPRESSION: Lungs clear. Heart size normal. Aortic Atherosclerosis (ICD10-I70.0). Electronically Signed   By: Lowella Grip III M.D.   On: 10/19/2020 12:16    ____________________________________________   PROCEDURES  Procedure(s) performed (including Critical Care):  Procedures  ____________________________________________   INITIAL IMPRESSION / ASSESSMENT AND PLAN     60 year old female presenting to the emergency department for treatment and evaluation of shortness of breath.  See HPI for further details.  Plan will be to start cardiac and shortness of breath work-up.  DIFFERENTIAL DIAGNOSIS  COVID-19, COPD exacerbation, asthma,  ED COURSE  Work-up in process.  Care relinquished to Dr. Cheri Fowler who will follow to disposition.    ___________________________________________   FINAL CLINICAL IMPRESSION(S) / ED DIAGNOSES  Final diagnoses:  SOB (shortness of breath)  COPD exacerbation [facility])     ED Discharge Orders          Ordered    Nebulizers (COMPRESSOR/NEBULIZER) MISC   Once        10/19/20 1334    albuterol (PROVENTIL) (2.5 MG/3ML) 0.083% nebulizer solution  Every 4 hours PRN        10/19/20 1334             Erika Allen was evaluated in Emergency Department on 10/19/2020 for the symptoms described in the  history of present illness. She was evaluated in the context of the global COVID-19 pandemic, which necessitated consideration that the patient might be at risk for infection with the SARS-CoV-2 virus that causes COVID-19. Institutional protocols and algorithms that pertain to the evaluation of patients at risk for COVID-19 are in a state of rapid change based on information released by regulatory bodies including the CDC and federal and state organizations. These policies and algorithms were followed during the patient's care in the ED.   Note:  This document was prepared using Dragon voice recognition software and may include unintentional dictation errors.    Victorino Dike, FNP 10/19/20 1757    Naaman Plummer, MD 10/20/20 [phone]

## 2020-12-15 ENCOUNTER — Other Ambulatory Visit: Payer: Medicare Other

## 2023-05-03 IMAGING — DX DG CHEST 1V PORT
1 series · 1 of 1 positions shown · non-contrast
Comparison: September 27, 2020

CLINICAL DATA: Shortness of breath

EXAM:
PORTABLE CHEST 1 VIEW

[chest ap]
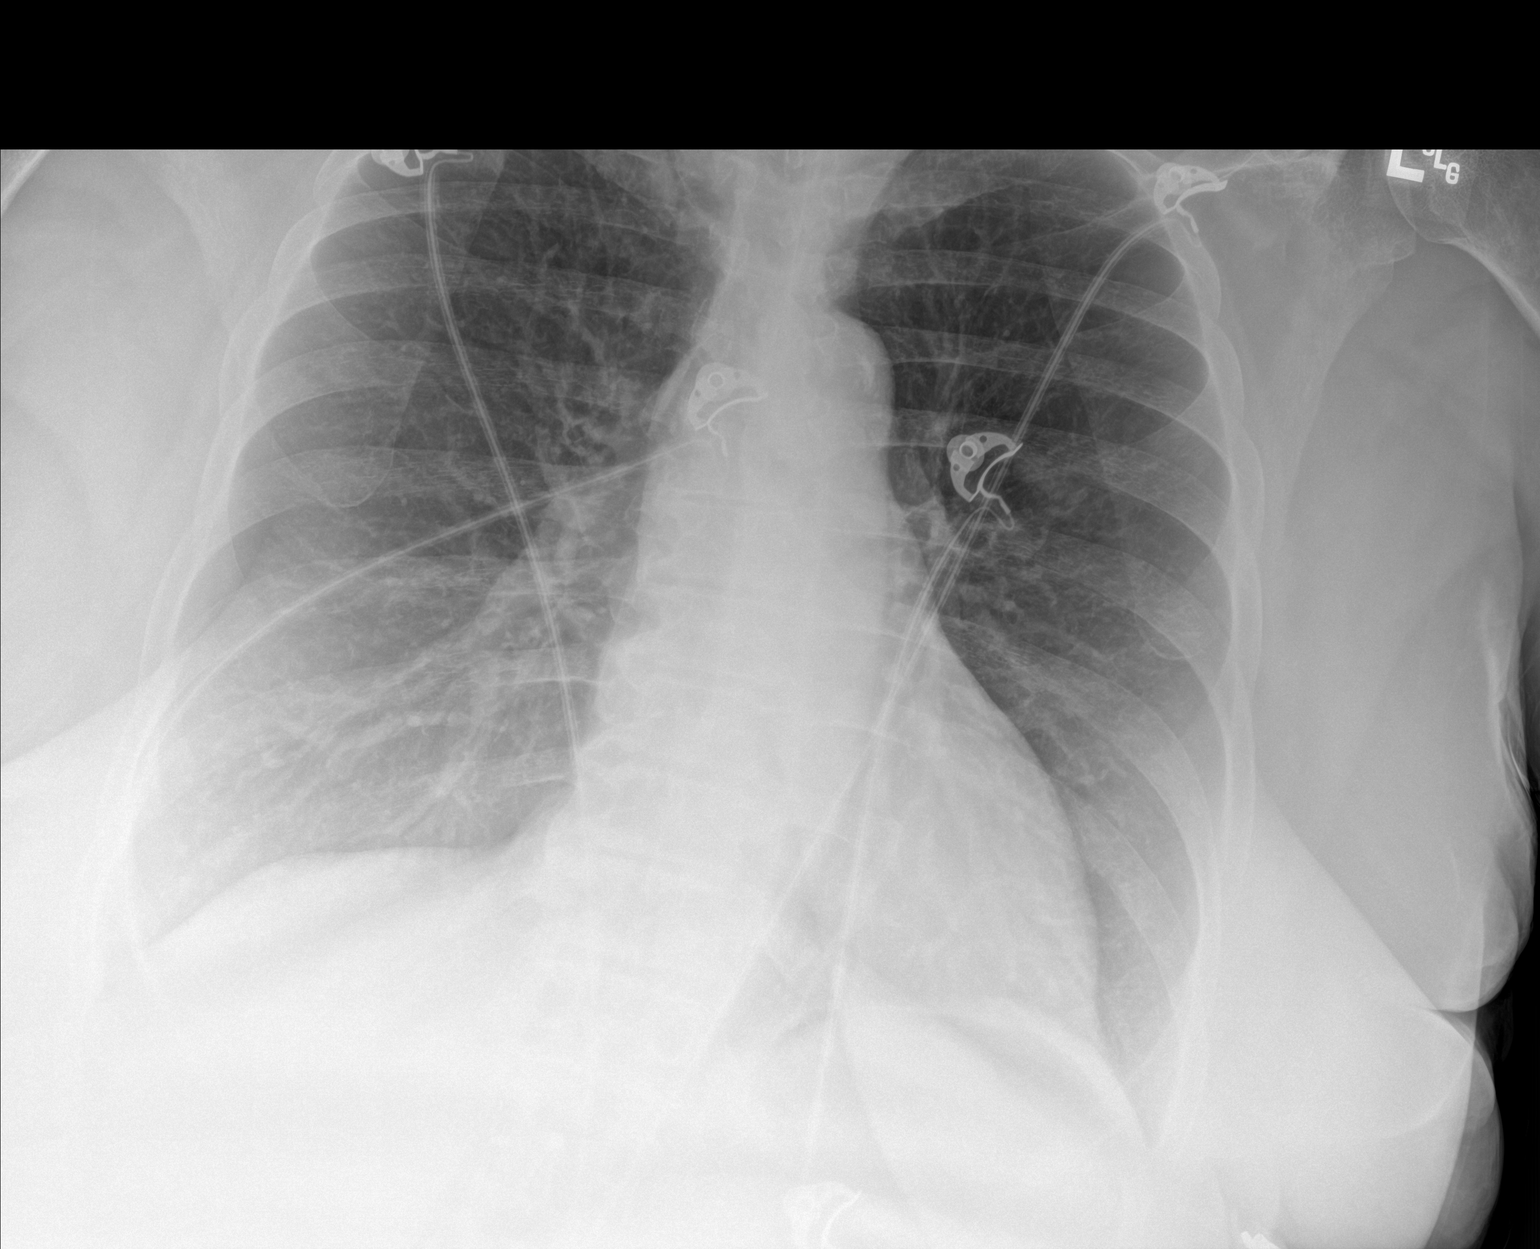

[1 of 1 positions shown; findings below may reference images not displayed]

FINDINGS: Lungs are clear. Heart size and pulmonary vascularity are normal. No
adenopathy. No bone lesions. There is aortic atherosclerosis.
IMPRESSION: Lungs clear. Heart size normal. Aortic Atherosclerosis
([id number]).

## 2023-07-31 ENCOUNTER — Other Ambulatory Visit: Payer: Self-pay

## 2023-07-31 MED ORDER — SEMAGLUTIDE-WEIGHT MANAGEMENT 0.25 MG/0.5ML ~~LOC~~ SOAJ
0.2500 mg | SUBCUTANEOUS | 0 refills | Status: AC
  Filled 2023-07-31 – 2023-08-12 (×2): qty 2, 28d supply, fill #0

## 2023-08-11 ENCOUNTER — Other Ambulatory Visit: Payer: Self-pay

## 2023-08-12 ENCOUNTER — Other Ambulatory Visit: Payer: Self-pay

## 2023-09-08 ENCOUNTER — Other Ambulatory Visit: Payer: Self-pay

## 2024-02-17 ENCOUNTER — Other Ambulatory Visit: Payer: Self-pay
